# Patient Record
Sex: Male | Born: 1975 | Race: Black or African American | Hispanic: No | Marital: Single | Smoking: Former smoker
Health system: Southern US, Community
[De-identification: ages and names within clinical notes are randomized; demographics above are authoritative.]

## PROBLEM LIST (undated history)

## (undated) DIAGNOSIS — K219 Gastro-esophageal reflux disease without esophagitis: Secondary | ICD-10-CM

## (undated) DIAGNOSIS — E669 Obesity, unspecified: Secondary | ICD-10-CM

## (undated) DIAGNOSIS — E559 Vitamin D deficiency, unspecified: Secondary | ICD-10-CM

## (undated) DIAGNOSIS — S0502XA Injury of conjunctiva and corneal abrasion without foreign body, left eye, initial encounter: Secondary | ICD-10-CM

## (undated) DIAGNOSIS — R51 Headache: Secondary | ICD-10-CM

## (undated) DIAGNOSIS — I10 Essential (primary) hypertension: Secondary | ICD-10-CM

## (undated) DIAGNOSIS — J45909 Unspecified asthma, uncomplicated: Secondary | ICD-10-CM

## (undated) DIAGNOSIS — J309 Allergic rhinitis, unspecified: Secondary | ICD-10-CM

## (undated) DIAGNOSIS — F32A Depression, unspecified: Secondary | ICD-10-CM

## (undated) DIAGNOSIS — F329 Major depressive disorder, single episode, unspecified: Secondary | ICD-10-CM

## (undated) HISTORY — PX: WISDOM TOOTH EXTRACTION: SHX21

## (undated) HISTORY — DX: Major depressive disorder, single episode, unspecified: F32.9

## (undated) HISTORY — DX: Obesity, unspecified: E66.9

## (undated) HISTORY — DX: Allergic rhinitis, unspecified: J30.9

## (undated) HISTORY — DX: Essential (primary) hypertension: I10

## (undated) HISTORY — DX: Vitamin D deficiency, unspecified: E55.9

## (undated) HISTORY — DX: Gastro-esophageal reflux disease without esophagitis: K21.9

## (undated) HISTORY — DX: Depression, unspecified: F32.A

## (undated) HISTORY — PX: OTHER SURGICAL HISTORY: SHX169

## (undated) HISTORY — DX: Injury of conjunctiva and corneal abrasion without foreign body, left eye, initial encounter: S05.02XA

---

## 1997-09-05 ENCOUNTER — Emergency Department (HOSPITAL_COMMUNITY): Admission: EM | Admit: 1997-09-05 | Discharge: 1997-09-05 | Payer: Self-pay | Admitting: Emergency Medicine

## 1997-09-18 ENCOUNTER — Emergency Department (HOSPITAL_COMMUNITY): Admission: EM | Admit: 1997-09-18 | Discharge: 1997-09-18 | Payer: Self-pay | Admitting: Emergency Medicine

## 2008-07-12 ENCOUNTER — Emergency Department (HOSPITAL_COMMUNITY): Admission: EM | Admit: 2008-07-12 | Discharge: 2008-07-12 | Payer: Self-pay | Admitting: Emergency Medicine

## 2009-01-24 ENCOUNTER — Ambulatory Visit: Payer: Self-pay | Admitting: Family Medicine

## 2009-01-24 DIAGNOSIS — K219 Gastro-esophageal reflux disease without esophagitis: Secondary | ICD-10-CM | POA: Insufficient documentation

## 2009-01-24 DIAGNOSIS — E669 Obesity, unspecified: Secondary | ICD-10-CM | POA: Insufficient documentation

## 2009-03-26 ENCOUNTER — Ambulatory Visit: Payer: Self-pay | Admitting: Physician Assistant

## 2009-03-31 LAB — CONVERTED CEMR LAB
BUN: 11 mg/dL (ref 6–23)
Basophils Relative: 0 % (ref 0–1)
CO2: 25 meq/L (ref 19–32)
Cholesterol: 139 mg/dL (ref 0–200)
Creatinine, Ser: 0.8 mg/dL (ref 0.40–1.50)
Eosinophils Absolute: 0.1 10*3/uL (ref 0.0–0.7)
Eosinophils Relative: 2 % (ref 0–5)
Glucose, Bld: 76 mg/dL (ref 70–99)
HCT: 46.7 % (ref 39.0–52.0)
Helicobacter pylori, IgM: <0.89 (ref <0.89)
Hemoglobin: 16.1 g/dL (ref 13.0–17.0)
MCHC: 34.5 g/dL (ref 30.0–36.0)
MCV: 86.3 fL (ref 78.0–100.0)
Monocytes Absolute: 0.4 10*3/uL (ref 0.1–1.0)
Monocytes Relative: 9 % (ref 3–12)
RBC: 5.41 M/uL (ref 4.22–5.81)
Sodium: 140 meq/L (ref 135–145)
Total Bilirubin: 0.5 mg/dL (ref 0.3–1.2)
Total Protein: 7.2 g/dL (ref 6.0–8.3)
Triglycerides: 70 mg/dL (ref <150)
VLDL: 14 mg/dL (ref 0–40)

## 2009-05-02 ENCOUNTER — Emergency Department (HOSPITAL_COMMUNITY): Admission: EM | Admit: 2009-05-02 | Discharge: 2009-05-02 | Payer: Self-pay | Admitting: Emergency Medicine

## 2009-06-17 ENCOUNTER — Emergency Department (HOSPITAL_COMMUNITY): Admission: EM | Admit: 2009-06-17 | Discharge: 2009-06-17 | Payer: Self-pay | Admitting: Emergency Medicine

## 2009-06-23 ENCOUNTER — Ambulatory Visit: Payer: Self-pay | Admitting: Physician Assistant

## 2009-06-23 DIAGNOSIS — E559 Vitamin D deficiency, unspecified: Secondary | ICD-10-CM | POA: Insufficient documentation

## 2009-06-23 DIAGNOSIS — J45909 Unspecified asthma, uncomplicated: Secondary | ICD-10-CM | POA: Insufficient documentation

## 2009-06-23 DIAGNOSIS — F329 Major depressive disorder, single episode, unspecified: Secondary | ICD-10-CM | POA: Insufficient documentation

## 2009-06-23 DIAGNOSIS — J309 Allergic rhinitis, unspecified: Secondary | ICD-10-CM | POA: Insufficient documentation

## 2009-06-23 LAB — CONVERTED CEMR LAB
Bilirubin Urine: NEGATIVE
Ketones, urine, test strip: NEGATIVE
Specific Gravity, Urine: 1.02
Urobilinogen, UA: 0.2

## 2009-06-25 ENCOUNTER — Encounter: Payer: Self-pay | Admitting: Physician Assistant

## 2009-06-25 LAB — CONVERTED CEMR LAB: Vit D, 25-Hydroxy: 26 ng/mL — ABNORMAL LOW (ref 30–89)

## 2010-01-08 ENCOUNTER — Telehealth (INDEPENDENT_AMBULATORY_CARE_PROVIDER_SITE_OTHER): Payer: Self-pay | Admitting: Nurse Practitioner

## 2010-04-14 NOTE — Progress Notes (Signed)
Summary: Office Visit//DEPRESSION SCREENING  Office Visit//DEPRESSION SCREENING   Imported By: Arta Bruce 06/25/2009 10:30:24  _____________________________________________________________________  External Attachment:    Type:   Image     Comment:   External Document

## 2010-04-14 NOTE — Assessment & Plan Note (Signed)
Summary: 3 MONTH FU/ACID REFLUX/CPE///KT   Vital Signs:  Patient profile:   35 year old male Height:      72 inches Weight:      239 pounds BMI:     32.53 Temp:     98.0 degrees F oral Pulse rate:   74 / minute Pulse rhythm:   regular Resp:     18 per minute BP sitting:   132 / 87  (left arm) Cuff size:   large  Vitals Entered By: Armenia Shannon (June 23, 2009 9:04 AM) CC: cpe Is Patient Diabetic? No Pain Assessment Patient in pain? no       Does patient need assistance? Functional Status Self care Ambulation Normal   Primary Care Provider:  Tereso Newcomer, PA-C  CC:  cpe.  History of Present Illness: Here for CPE.  GERD:  Taking omeprazole 40 mg once daily.  Denies any stomach discomfort of dyspepsia.  Does have a lot of belching.  Has cut down on portion size.  No caffeine.  Stopped smoking THC in Jan. and no cigs.  No dysphagia.  No melena, hematochezia, hematemesis.  No unintended weight loss.  However, does have symptoms of increased belching and chest pressure with eating, nausea and questionable dysphagia.  Migraine HA's:  Went to ED last week.  I was not aware he had migraine HAs.  Reports h/o HAs on and off for years.  Usually could take extra strength tylenol or excedrin with relief.  Had an injection in the ED. Was told to ask about medicine for headaches.  Usually right sided.  No throbbing.  No nausea.  No scotoma.  He does note photophobia.  Reports headaches about once a week.  Seems to be related to allergies.  Reports a lot of sneezing and sinus congestion.    PHQ9= 6 today.  Has a child with terminal illness.  No SI.  Asthma History    Initial Asthma Severity Rating:    Age range: 12+ years    Symptoms: daily    Nighttime Awakenings: 0-2/month    Interferes w/ normal activity: some limitations    SABA use (not for EIB): daily    Asthma Severity Assessment: Moderate Persistent  Problems Prior to Update: 1)  Depression  (ICD-311) 2)  Asthma   (ICD-493.90) 3)  Allergic Rhinitis  (ICD-477.9) 4)  Vitamin D Deficiency  (ICD-268.9) 5)  Preventive Health Care  (ICD-V70.0) 6)  Obesity, Unspecified  (ICD-278.00) 7)  Gerd, Severe  (ICD-530.81) 8)  Family History Diabetes 1st Degree Relative  (ICD-V18.0)  Current Medications (verified): 1)  Omeprazole 40 Mg Cpdr (Omeprazole) .... Take 1 Tablet By Mouth Once A Day 2)  Ergocalciferol 50000 Unit Caps (Ergocalciferol) .Marland Kitchen.. 1 By Mouth Once A Week For 12 Weeks  Allergies (verified): No Known Drug Allergies  Past History:  Past Medical History: Current Problems:  VITAMIN D DEFICIENCY (ICD-268.9) OBESITY, UNSPECIFIED (ICD-278.00) GERD, SEVERE (ICD-530.81) Migraine Headaches  Family History: Reviewed history from 01/24/2009 and no changes required. Family History Diabetes 1st degree relative  Social History: Reviewed history from 01/24/2009 and no changes required. Occupation: Unemployed, day laborer Single Former Smoker. Quit 7 months. 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  The patient denies fever, chest pain, syncope, melena, hematochezia, and hematuria.    Physical Exam  General:  alert, well-developed,  and well-nourished.   Head:  normocephalic and atraumatic.   Eyes:  pupils equal, pupils round, pupils reactive to light, and no optic disk abnormalities.   Ears:  R ear normal and L ear normal.   Nose:  no external deformity.   Mouth:  pharynx pink and moist.   uvula enlarged Neck:  supple and no thyromegaly.   Lungs:  normal breath sounds.   Heart:  normal rate, regular rhythm, and no murmur.   Abdomen:  soft, non-tender, normal bowel sounds, and no hepatomegaly.   Rectal:  deferred Genitalia:  circumcised, no hydrocele, no varicocele, no scrotal masses, no cutaneous lesions, and no urethral  discharge.   Msk:  normal ROM.   Extremities:  no edema Neurologic:  alert & oriented X3 and cranial nerves II-XII intact.   Skin:  turgor normal.   Psych:  normally interactive.     Impression & Recommendations:  Problem # 1:  VITAMIN D DEFICIENCY (ICD-268.9)  check labs today  Orders: T-Vitamin D (25-Hydroxy) (16109-60454)  Problem # 2:  ALLERGIC RHINITIS (ICD-477.9)  suspect his headaches are related to his allergies add zyrtec and flonase naproxen as needed for headaches.  His updated medication list for this problem includes:    Cetirizine Hcl 10 Mg Tabs (Cetirizine hcl) .Marland Kitchen... Take 1 tablet by mouth once a day for  allergies    Flonase 50 Mcg/act Susp (Fluticasone propionate) .Marland Kitchen... 1-2 sprays each nostril once daily for allergies (use for 3 weeks, then take one week off, etc)  Problem # 3:  ASTHMA (ICD-493.90)  first time he has told me about this now tells me he used to have an inhaler add advair and proventil f/u with me in 6 weeks and consider changing to flovent AAP done   His updated medication list for this problem includes:    Advair Diskus 250-50 Mcg/dose Aepb (Fluticasone-salmeterol) .Marland Kitchen... 1 puff two times a day    Proventil Hfa 108 (90 Base) Mcg/act Aers (Albuterol sulfate) .Marland Kitchen... 1-2 puffs every 4-6 hours as needed  Problem # 4:  PREVENTIVE HEALTH CARE (ICD-V70.0)  Orders: UA Dipstick w/o Micro (manual) (09811)  Problem # 5:  GERD, SEVERE (ICD-530.81) still having symptoms of breakthrough dyspepsia on PPI change to protonix re-eval at f/u . Marland Kitchen Marland Kitchen if no better, refer to GI  The following medications were removed from the medication list:    Omeprazole 40 Mg Cpdr (Omeprazole) .Marland Kitchen... Take 1 tablet by mouth once a day His updated medication list for this problem includes:    Nexium 40 Mg Cpdr (Esomeprazole magnesium) .Marland Kitchen... Take 1 cap two times a day for one week, then change to 1 cap once daily.  Problem # 6:  DEPRESSION (ICD-311)  refer to LCSW  first  Orders: Psychology Referral (Psychology)  Complete Medication List: 1)  Ergocalciferol 50000 Unit Caps (Ergocalciferol) .Marland Kitchen.. 1 by mouth once a week for 12 weeks 2)  Cetirizine Hcl 10 Mg Tabs (Cetirizine hcl) .... Take 1 tablet by mouth once a day for  allergies 3)  Flonase 50 Mcg/act Susp (Fluticasone propionate) .Marland Kitchen.. 1-2 sprays each nostril once daily for allergies (use for 3 weeks, then take one week off, etc) 4)  Nexium 40 Mg Cpdr (Esomeprazole magnesium) .... Take 1 cap two times a day for one week, then change to 1 cap once daily. 5)  Advair Diskus 250-50 Mcg/dose Aepb (Fluticasone-salmeterol) .Marland Kitchen.. 1 puff two times a day 6)  Proventil Hfa 108 (90 Base) Mcg/act Aers (Albuterol sulfate) .Marland Kitchen.. 1-2  puffs every 4-6 hours as needed 7)  Peak Flow Meter Devi (Peak flow meter) .... Use as directed 8)  Naprosyn 500 Mg Tabs (Naproxen) .... Take 1 tablet by mouth two times a day as needed for pain or headache.  take with food.  Asthma Management Plan    Asthma Severity: Moderate Persistent    Personal best PEF: 430 liters/minute    Predicted PEF: 649 liters/minute    Working PEF: 430 liters/minute    Plan based on PEF formula: [employer] Zone: (Range: 340 to 430) ADVAIR DISKUS 250-50 MCG/DOSE AEPB:  1 inhalation twice a day   Yellow Zone: ADVAIR DISKUS 250-50 MCG/DOSE AEPB:  1 inhalation twice a day PROVENTIL HFA 108 (90 BASE) MCG/ACT AERS:  2 puffs every 4 hours as needed  Red Zone: ADVAIR DISKUS 250-50 MCG/DOSE AEPB:  1 inhalation twice a day PROVENTIL HFA 108 (90 BASE) MCG/ACT AERS:  2 puffs every 4 hours as needed Call your physician for shortness of breath.      Patient Instructions: 1)  Repeat urinalysis in 2 weeks (have patient do first morning specimen). 2)  Td shot today. 3)  Use advair two times a day every day. 4)  Make sure you rinse your mouth out well after using Advair. 5)  Use proventil as needed. 6)  See Asthma Action Plan. 7)  I gave you a  prescription for a peak flow meter that you can use if you want. 8)  Stop omeprazole and start taking Nexium.  Use two times a day for a week, then once daily. 9)  Use cetirizine and flonase for your allergies. 10)  Do not take excedrin or ibuprofen with Naproxen.  They are basically the same thing. 11)  Please schedule a follow-up appointment in 6 weeks with Makaila Windle for Asthma.  Prescriptions: NAPROSYN 500 MG TABS (NAPROXEN) Take 1 tablet by mouth two times a day as needed for pain or headache.  Take with food.  #30 x 1   Entered and Authorized by:   Tereso Newcomer PA-C   Signed by:   Tereso Newcomer PA-C on 06/23/2009   Method used:   Print then Give to Patient   RxID:   [id number] PEAK FLOW METER  DEVI (PEAK FLOW METER) use as directed  #1 x 0   Entered and Authorized by:   Tereso Newcomer PA-C   Signed by:   Tereso Newcomer PA-C on 06/23/2009   Method used:   Print then Give to Patient   RxID:   [id number] PROVENTIL HFA 108 (90 BASE) MCG/ACT AERS (ALBUTEROL SULFATE) 1-2 puffs every 4-6 hours as needed  #1 x 5   Entered and Authorized by:   Tereso Newcomer PA-C   Signed by:   Tereso Newcomer PA-C on 06/23/2009   Method used:   Print then Give to Patient   RxID:   [id number] ADVAIR DISKUS 250-50 MCG/DOSE AEPB (FLUTICASONE-SALMETEROL) 1 puff two times a day  #1 x 5   Entered and Authorized by:   Tereso Newcomer PA-C   Signed by:   Tereso Newcomer PA-C on 06/23/2009   Method used:   Print then Give to Patient   RxID:   [id number] NEXIUM 40 MG CPDR (ESOMEPRAZOLE MAGNESIUM) Take 1 cap two times a day for one week, then change to 1 cap once daily.  #30 x 5   Entered and Authorized by:   Tereso Newcomer PA-C   Signed by:   Tereso Newcomer PA-C on 06/23/2009  Method used:   Print then Give to Patient   RxID:   [id number] FLONASE 50 MCG/ACT SUSP (FLUTICASONE PROPIONATE) 1-2 sprays each nostril once daily for allergies (use for 3 weeks, then take one week off, etc)  #1 x 5   Entered and  Authorized by:   Tereso Newcomer PA-C   Signed by:   Tereso Newcomer PA-C on 06/23/2009   Method used:   Print then Give to Patient   RxID:   [id number] CETIRIZINE HCL 10 MG TABS (CETIRIZINE HCL) Take 1 tablet by mouth once a day for  allergies  #30 x 5   Entered and Authorized by:   Tereso Newcomer PA-C   Signed by:   Tereso Newcomer PA-C on 06/23/2009   Method used:   Print then Give to Patient   RxID:   [id number]   Laboratory Results   Urine Tests  Date/Time Received: June 23, 2009 9:20 AM   Routine Urinalysis   Glucose: negative   (Normal Range: Negative) Bilirubin: negative   (Normal Range: Negative) Ketone: negative   (Normal Range: Negative) Spec. Gravity: 1.020   (Normal Range: 1.003-1.035) Blood: negative   (Normal Range: Negative) pH: 7.5   (Normal Range: 5.0-8.0) Protein: trace   (Normal Range: Negative) Urobilinogen: 0.2   (Normal Range: 0-1) Nitrite: negative   (Normal Range: Negative) Leukocyte Esterace: negative   (Normal Range: Negative)

## 2010-04-14 NOTE — Assessment & Plan Note (Signed)
Summary: 2 MO F/U///////RJP   Vital Signs:  Patient profile:   35 year old male Height:      72 inches Weight:      245 pounds BMI:     33.35 Temp:     98.2 degrees F oral Pulse rate:   70 / minute Pulse rhythm:   regular Resp:     20 per minute BP sitting:   141 / 91  (left arm) Cuff size:   large  Vitals Entered By: [place] Shannon (March 26, 2009 10:40 AM) CC: two month f/u.... pt has questions about the med he is using... pt would like something a little stronger... Is Patient Diabetic? No Pain Assessment Patient in pain? no      CBG Result 86  Does patient need assistance? Functional Status Self care Ambulation Normal   Primary Care Provider:  Tereso Newcomer, PA-C  CC:  two month f/u.... pt has questions about the med he is using... pt would like something a little stronger....  History of Present Illness: 35 year old male presents for followup on acid reflux.  He was previously seen by Dr. Carolyne Fiscal.  He was prescribed omeprazole 20 mg a day.  Labs were ordered but never performed.  The patient notes that his symptoms, overall, are improved.  He mainly had retrosternal burning, water brash symptoms and belching.  He has also had some nausea.  He denies dysphagia.  He denies hematemesis.  He denies melena or hematochezia.  He denies odynophagia.  He denies any weight loss.  He does admit to drinking caffeine several times a week.  He also admits to eating spicy foods. a  Habits & Providers  Alcohol-Tobacco-Diet     Alcohol drinks/day: <1     Tobacco Status: quit  Problems Prior to Update: 1)  Preventive Health Care  (ICD-V70.0) 2)  Obesity, Unspecified  (ICD-278.00) 3)  Gerd, Severe  (ICD-530.81) 4)  Family History Diabetes 1st Degree Relative  (ICD-V18.0)  Current Medications (verified): 1)  Omeprazole 20 Mg Cpdr (Omeprazole) .Marland Kitchen.. 1 By Mouth Daily  Allergies (verified): No Known Drug Allergies  Past History:  Social History: Last updated:  01/24/2009 Occupation: Unemployed, day laborer Single Former Smoker. Quit 7 months. Alcohol use-no Drug use-no, h/o THC last 7 months ago Regular exercise-no  Physical Exam  General:  alert, well-developed, and well-nourished.   Head:  normocephalic and atraumatic.   Neck:  supple and no carotid bruits.   Lungs:  normal breath sounds, no crackles, and no wheezes.   Heart:  normal rate and regular rhythm.   Abdomen:  soft, non-tender, normal bowel sounds, and no hepatomegaly.   Neurologic:  alert & oriented X3 and cranial nerves II-XII intact.   Psych:  normally interactive.     Impression & Recommendations:  Problem # 1:  GERD, SEVERE (ICD-530.81) symptoms overall improved he has not been following proper diet like he should increase omeprazole to 40 mg daily labs not in system from last visit will check on them or redraw if no improvement at f/u, consider referral to GI  His updated medication list for this problem includes:    Omeprazole 40 Mg Cpdr (Omeprazole) .Marland Kitchen... Take 1 tablet by mouth once a day  Problem # 2:  PREVENTIVE HEALTH CARE (ICD-V70.0)  as above, check on labs from last visit will also get CBC, TSH and RPR schedule CPE  Orders: T-CBC w/Diff ([address]109-60454) T-TSH (09811-91478) T-Syphilis Test (RPR) (29562-13086)  Complete Medication List: 1)  Omeprazole 40 Mg Cpdr (  Omeprazole) .... Take 1 tablet by mouth once a day  Patient Instructions: 1)  Please schedule a follow-up appointment in 3 months with Lotta Frankenfield for acid reflux and CPE. 2)  Avoid foods high in acid(tomatoes, citrus juices,spicy foods).Avoid eating within two hours of lying down or before exercising. Do not over eat: try smaller more frequent meals. Elevate head of bed twelve inches when sleeping.  Prescriptions: OMEPRAZOLE 40 MG CPDR (OMEPRAZOLE) Take 1 tablet by mouth once a day  #30 x 5   Entered and Authorized by:   Tereso Newcomer PA-C   Signed by:   Tereso Newcomer PA-C on 03/26/2009   Method  used:   Print then Give to Patient   RxID:   [id number]     Laboratory Results   Blood Tests     CBG Random:: 86mg /dL  Date/Time Received: 14-78-29  Other Tests  Rapid HIV: negative

## 2010-04-14 NOTE — Progress Notes (Signed)
Summary: Query:  Refill Nexium?  Phone Note Outgoing Call   Summary of Call: Do you want to refill his nexium?  Last seen 06/2009. Initial call taken by: Dutch Quint RN,  January 08, 2010 5:06 PM  Follow-up for Phone Call        ok to refill Follow-up by: Lehman Prom FNP,  January 08, 2010 5:57 PM  Additional Follow-up for Phone Call Additional follow up Details #1::        Noted. Dutch Quint RN  January 09, 2010 10:58 AM

## 2010-06-03 LAB — POCT I-STAT, CHEM 8
Chloride: 110 mEq/L (ref 96–112)
HCT: 47 % (ref 39.0–52.0)
Potassium: 3.6 mEq/L (ref 3.5–5.1)

## 2010-09-11 ENCOUNTER — Other Ambulatory Visit: Payer: Self-pay | Admitting: Physician Assistant

## 2011-09-28 ENCOUNTER — Emergency Department (HOSPITAL_COMMUNITY)
Admission: EM | Admit: 2011-09-28 | Discharge: 2011-09-28 | Disposition: A | Discharge status: Home / Self Care | Payer: Medicaid Other | Attending: Emergency Medicine | Admitting: Emergency Medicine

## 2011-09-28 ENCOUNTER — Encounter (HOSPITAL_COMMUNITY): Payer: Self-pay | Admitting: Emergency Medicine

## 2011-09-28 DIAGNOSIS — Y9383 Activity, rough housing and horseplay: Secondary | ICD-10-CM | POA: Insufficient documentation

## 2011-09-28 DIAGNOSIS — S0502XA Injury of conjunctiva and corneal abrasion without foreign body, left eye, initial encounter: Secondary | ICD-10-CM

## 2011-09-28 DIAGNOSIS — S058X9A Other injuries of unspecified eye and orbit, initial encounter: Secondary | ICD-10-CM | POA: Insufficient documentation

## 2011-09-28 DIAGNOSIS — IMO0002 Reserved for concepts with insufficient information to code with codable children: Secondary | ICD-10-CM | POA: Insufficient documentation

## 2011-09-28 DIAGNOSIS — K219 Gastro-esophageal reflux disease without esophagitis: Secondary | ICD-10-CM | POA: Insufficient documentation

## 2011-09-28 HISTORY — DX: Gastro-esophageal reflux disease without esophagitis: K21.9

## 2011-09-28 MED ORDER — PROPARACAINE HCL 0.5 % OP SOLN
1.0000 [drp] | Freq: Once | OPHTHALMIC | Status: AC
  Administered 2011-09-28: 1 [drp] via OPHTHALMIC
  Filled 2011-09-28: qty 15

## 2011-09-28 MED ORDER — OXYCODONE-ACETAMINOPHEN 5-325 MG PO TABS: 1.0000 | ORAL_TABLET | ORAL | Status: AC | PRN

## 2011-09-28 MED ORDER — FLUORESCEIN SODIUM 1 MG OP STRP
1.0000 | ORAL_STRIP | Freq: Once | OPHTHALMIC | Status: AC
  Administered 2011-09-28: 1 via OPHTHALMIC
  Filled 2011-09-28: qty 1

## 2011-09-28 MED ORDER — OXYCODONE-ACETAMINOPHEN 5-325 MG PO TABS
1.0000 | ORAL_TABLET | Freq: Once | ORAL | Status: AC
  Administered 2011-09-28: 1 via ORAL
  Filled 2011-09-28: qty 1

## 2011-09-28 MED ORDER — CIPROFLOXACIN HCL 0.3 % OP SOLN: 1.0000 [drp] | OPHTHALMIC | Status: AC

## 2011-09-28 NOTE — ED Notes (Signed)
Patient discharge to home with written and verbal instructions with steady gait. Respirations equal and unlabored. Skin warm and dry. No acute distress noted. 

## 2011-09-28 NOTE — ED Notes (Signed)
Pt states he was poked in his left eye by someones finger about 2200 Monday night  Pt states his eye is painful and it is causing his nose to run   Eye is red and irritated and pt has swelling noted to his eye

## 2011-09-28 NOTE — ED Provider Notes (Signed)
History     CSN: [id number]  Arrival date & time 09/28/11  4098   First MD Initiated Contact with Patient 09/28/11 0407      Chief Complaint  Patient presents with  . Eye Injury    (Consider location/radiation/quality/duration/timing/severity/associated sxs/prior treatment) HPI Comments: Pt was rough-housing with a friend when he was accidentally poked in the L eye, at about 2200 this evening. Pt has had severe burning pain, FB sensation, watering, redness, and blurred vision to the eye since this occurred. States his nose has been running as well. He is not a contact lens wearer. Has not been seen by ophtho previously. No tx PTA.  Patient is a 36 y.o. male presenting with eye injury. The history is provided by the patient.  Eye Injury This is a new problem. The current episode started today. The problem occurs constantly. The problem has been gradually worsening. Associated symptoms include headaches and a visual change. Pertinent negatives include no vomiting. Nothing aggravates the symptoms. He has tried nothing for the symptoms.    Past Medical History  Diagnosis Date  . Acid reflux     History reviewed. No pertinent past surgical history.  Family History  Problem Relation Age of Onset  . Diabetes Other   . Hypertension Other     History  Substance Use Topics  . Smoking status: Never Smoker   . Smokeless tobacco: Not on file  . Alcohol Use: Yes     social      Review of Systems  Gastrointestinal: Negative for vomiting.  Neurological: Positive for headaches.  ROS as per HPI  Allergies  Review of patient's allergies indicates no known allergies.  Home Medications   Current Outpatient Rx  Name Route Sig Dispense Refill  . ALBUTEROL SULFATE HFA 108 (90 BASE) MCG/ACT IN AERS Inhalation Inhale 2 puffs into the lungs every 6 (six) hours as needed.    Marland Kitchen ESOMEPRAZOLE MAGNESIUM 40 MG PO CPDR Oral Take 40 mg by mouth daily before breakfast.    . NAPROXEN 500 MG PO  TABS Oral Take 500 mg by mouth 2 (two) times daily as needed.    Marland Kitchen CIPROFLOXACIN HCL 0.3 % OP SOLN Left Eye Place 1 drop into the left eye every 2 (two) hours. Administer 1 drop, every 2 hours, while awake, for 2 days. Then 1 drop, every 4 hours, while awake, for the next 5 days. 5 mL 0  . OXYCODONE-ACETAMINOPHEN 5-325 MG PO TABS Oral Take 1 tablet by mouth every 4 (four) hours as needed for pain. 10 tablet 0    BP 120/70  Pulse 82  Temp 98 F (36.7 C) (Oral)  Resp 20  SpO2 100%  Physical Exam  Nursing note and vitals reviewed. Constitutional: He appears well-developed and well-nourished. No distress.  HENT:  Head: Normocephalic and atraumatic.  Eyes: EOM are normal. Pupils are equal, round, and reactive to light. No foreign bodies found.  Slit lamp exam:      The left eye shows corneal abrasion and fluorescein uptake. The left eye shows no corneal ulcer and no foreign body.         L eye: erythema of sclera, most pronounced medially; three discrete areas of increased fluorescein uptake on Woods lamp exam, as diagrammed; pt with excess tearing of eye and light sensitivity; vision 20/40 uncorrected in affected eye, 20/20 in good uncorrected eye, 20/20 bilat  Neck: Normal range of motion.  Cardiovascular: Normal rate.   Pulmonary/Chest: Effort normal.  Musculoskeletal: Normal  range of motion.  Neurological: He is alert.  Skin: Skin is warm and dry. He is not diaphoretic.  Psychiatric: He has a normal mood and affect.    ED Course  Procedures (including critical care time)  Labs Reviewed - No data to display No results found.   1. Corneal abrasion, left       MDM  Pt presents with FB sensation, watering, pain, and blurred vision to eye since getting poked in the eye earlier this evening with a finger. 3 discrete areas of increased uptake on fluorescein stain indicating corneal abrasion. Pt had almost immediate relief of sx with administration of proparacaine drops. Rxes for  Ciloxan and Percocet. Findings shown to and d/w pt and mom at bedside. Emphasized importance of prompt f/u with ophtho. They verbalized understanding of this. Reasons to return discussed.         Grant Fontana, PA-C 09/28/11 [phone]

## 2011-10-08 NOTE — ED Provider Notes (Signed)
Medical screening examination/treatment/procedure(s) were performed by non-physician practitioner and as supervising physician I was immediately available for consultation/collaboration.  Rotha Cassels K Areej Tayler-Rasch, MD 10/08/11 2312 

## 2011-12-23 ENCOUNTER — Emergency Department (HOSPITAL_COMMUNITY)
Admission: EM | Admit: 2011-12-23 | Discharge: 2011-12-23 | Disposition: A | Discharge status: Home / Self Care | Payer: Medicaid Other | Attending: Emergency Medicine | Admitting: Emergency Medicine

## 2011-12-23 ENCOUNTER — Encounter (HOSPITAL_COMMUNITY): Payer: Self-pay | Admitting: Emergency Medicine

## 2011-12-23 ENCOUNTER — Emergency Department (HOSPITAL_COMMUNITY): Payer: Medicaid Other

## 2011-12-23 DIAGNOSIS — J45909 Unspecified asthma, uncomplicated: Secondary | ICD-10-CM | POA: Insufficient documentation

## 2011-12-23 DIAGNOSIS — R079 Chest pain, unspecified: Secondary | ICD-10-CM

## 2011-12-23 DIAGNOSIS — K219 Gastro-esophageal reflux disease without esophagitis: Secondary | ICD-10-CM | POA: Insufficient documentation

## 2011-12-23 HISTORY — DX: Unspecified asthma, uncomplicated: J45.909

## 2011-12-23 HISTORY — DX: Headache: R51

## 2011-12-23 LAB — COMPREHENSIVE METABOLIC PANEL
Alkaline Phosphatase: 68 U/L (ref 39–117)
BUN: 9 mg/dL (ref 6–23)
Chloride: 102 mEq/L (ref 96–112)
GFR calc Af Amer: >90 mL/min (ref >90)
Glucose, Bld: 98 mg/dL (ref 70–99)
Potassium: 3.6 mEq/L (ref 3.5–5.1)
Total Bilirubin: 0.4 mg/dL (ref 0.3–1.2)

## 2011-12-23 LAB — POCT I-STAT TROPONIN I: Troponin i, poc: 0 ng/mL (ref 0.00–0.08)

## 2011-12-23 LAB — CBC
HCT: 44.1 % (ref 39.0–52.0)
Hemoglobin: 15.7 g/dL (ref 13.0–17.0)
WBC: 6 10*3/uL (ref 4.0–10.5)

## 2011-12-23 MED ORDER — SUCRALFATE 1 G PO TABS: 1.0000 g | ORAL_TABLET | Freq: Four times a day (QID) | ORAL | Status: DC

## 2011-12-23 MED ORDER — SODIUM CHLORIDE 0.9 % IV SOLN
1000.0000 mL | INTRAVENOUS | Status: DC
  Administered 2011-12-23: 1000 mL via INTRAVENOUS

## 2011-12-23 MED ORDER — GI COCKTAIL ~~LOC~~
30.0000 mL | Freq: Once | ORAL | Status: AC
  Administered 2011-12-23: 30 mL via ORAL
  Filled 2011-12-23: qty 30

## 2011-12-23 NOTE — ED Notes (Signed)
Discharge instructions reviewed. Rx given. All questions answered.  

## 2011-12-23 NOTE — ED Notes (Signed)
Pt presenting to ed with c/o chest discomfort x 1 week. Pt states positive shortness of breath. Pt states "it feels like air is pressing on my chest". Pt states positive nausea no vomiting pt states he makes his self throw up to get relief. Pt states chest tightness. Pt states he isn't sure if it's his reflux but it's worse than usual

## 2011-12-23 NOTE — ED Provider Notes (Signed)
History     CSN: [id number]  Arrival date & time 12/23/11  1647   First MD Initiated Contact with Patient 12/23/11 1923      Chief Complaint  Patient presents with  . chest discomfort    HPI Comments: He has been having several episode over the last few weeks.  It feels like gas in his chest but he also feels like he cant breathe and something is pressing on his chest.  Today it was more severe so he became concerned. Pt had pizza and doritos, and ham sandwich today.  Patient is a 36 y.o. male presenting with chest pain. The history is provided by the patient.  Chest Pain The chest pain began 1 - 2 weeks ago (Today it has been constant since 1330). Chest pain occurs constantly. The chest pain is unchanged. The severity of the pain is moderate. The quality of the pain is described as pressure-like. The pain does not radiate. Exacerbated by: not worse with exertion, increases with eating. Pertinent negatives for primary symptoms include no fever.  Pertinent negatives for past medical history include no CAD, no DVT, no MI and no PE.  Pertinent negatives for family medical history include: no CAD in family.   No recent trips or travel.  Past Medical History  Diagnosis Date  . Acid reflux   . Headache   . Asthma     History reviewed. No pertinent past surgical history.  Family History  Problem Relation Age of Onset  . Diabetes Other   . Hypertension Other     History  Substance Use Topics  . Smoking status: Never Smoker   . Smokeless tobacco: Not on file  . Alcohol Use: Yes     social      Review of Systems  Constitutional: Negative for fever.  Cardiovascular: Positive for chest pain.  All other systems reviewed and are negative.    Allergies  Review of patient's allergies indicates no known allergies.  Home Medications   Current Outpatient Rx  Name Route Sig Dispense Refill  . ALBUTEROL SULFATE HFA 108 (90 BASE) MCG/ACT IN AERS Inhalation Inhale 2 puffs into  the lungs every 6 (six) hours as needed. For shortness of breath.    Marland Kitchen NAPROXEN 500 MG PO TABS Oral Take 500 mg by mouth 2 (two) times daily as needed. For pain.    Marland Kitchen PANTOPRAZOLE SODIUM 40 MG PO TBEC Oral Take 40 mg by mouth daily.      BP 133/79  Pulse 72  Temp 98.2 F (36.8 C) (Oral)  Resp 20  SpO2 98%  Physical Exam  Nursing note and vitals reviewed. Constitutional: He appears well-developed and well-nourished. No distress.  HENT:  Head: Normocephalic and atraumatic.  Right Ear: External ear normal.  Left Ear: External ear normal.  Eyes: Conjunctivae normal are normal. Right eye exhibits no discharge. Left eye exhibits no discharge. No scleral icterus.  Neck: Neck supple. No tracheal deviation present.  Cardiovascular: Normal rate, regular rhythm and intact distal pulses.   Pulmonary/Chest: Effort normal and breath sounds normal. No stridor. No respiratory distress. He has no wheezes. He has no rales.  Abdominal: Soft. Bowel sounds are normal. He exhibits no distension. There is no tenderness. There is no rebound and no guarding.  Musculoskeletal: He exhibits no edema and no tenderness.  Neurological: He is alert. He has normal strength. No sensory deficit. Cranial nerve deficit:  no gross defecits noted. He exhibits normal muscle tone. He displays no seizure  activity. Coordination normal.  Skin: Skin is warm and dry. No rash noted.  Psychiatric: He has a normal mood and affect.    ED Course  Procedures (including critical care time) Rate 72 SINUS RHYTHM ~ normal P axis, V-rate 50- 99 BORDERLINE RIGHT AXIS DEVIATION ~ QRS axis ( 90, 99) ST ELEV, PROBABLE NORMAL EARLY REPOL PATTERN ~ ST elevation, age<55 BASELINE WANDER IN LEAD(S) V6  Labs Reviewed  CBC  COMPREHENSIVE METABOLIC PANEL  POCT I-STAT TROPONIN I   Dg Chest 2 View  12/23/2011  *RADIOLOGY REPORT*  Clinical Data: Chest pain, shortness of breath, cough  CHEST - 2 VIEW  Comparison: 07/12/2008  Findings:  Unchanged cardiac silhouette and mediastinal contours. No focal parenchymal opacities.  No pleural effusion or pneumothorax.  Unchanged bones.  IMPRESSION: No acute cardiopulmonary disease.  Specifically, no evidence of pneumonia.   Original Report Authenticated By: Waynard Reeds, M.D.      1. Chest pain   2. GERD (gastroesophageal reflux disease)       MDM  The patient's symptoms are suggestive of esophageal reflux. He has been having symptoms for one week and has a reassuring EKG as well as cardiac enzymes.  There is no evidence of pneumonia or other acute cardiopulmonary abnormality noted on chest x-ray the patient is low risk for pulmonary embolism and perc negative.  He noted some improvement with the GI cocktail. I will try adding Carafate to his regimen and have him follow up with her primary care Dr. consider seeing a gastroenterologist        Celene Kras, MD 12/23/11 2222

## 2011-12-27 ENCOUNTER — Encounter (HOSPITAL_COMMUNITY): Payer: Self-pay | Admitting: Emergency Medicine

## 2011-12-27 ENCOUNTER — Emergency Department (HOSPITAL_COMMUNITY)
Admission: EM | Admit: 2011-12-27 | Discharge: 2011-12-28 | Disposition: A | Discharge status: Home / Self Care | Payer: Medicaid Other | Attending: Emergency Medicine | Admitting: Emergency Medicine

## 2011-12-27 DIAGNOSIS — K30 Functional dyspepsia: Secondary | ICD-10-CM

## 2011-12-27 DIAGNOSIS — R079 Chest pain, unspecified: Secondary | ICD-10-CM | POA: Insufficient documentation

## 2011-12-27 DIAGNOSIS — J45909 Unspecified asthma, uncomplicated: Secondary | ICD-10-CM | POA: Insufficient documentation

## 2011-12-27 DIAGNOSIS — K3189 Other diseases of stomach and duodenum: Secondary | ICD-10-CM | POA: Insufficient documentation

## 2011-12-27 DIAGNOSIS — R1013 Epigastric pain: Secondary | ICD-10-CM | POA: Insufficient documentation

## 2011-12-27 DIAGNOSIS — R0602 Shortness of breath: Secondary | ICD-10-CM | POA: Insufficient documentation

## 2011-12-27 MED ORDER — SIMETHICONE 80 MG PO CHEW
80.0000 mg | CHEWABLE_TABLET | Freq: Once | ORAL | Status: AC
  Administered 2011-12-27: 80 mg via ORAL
  Filled 2011-12-27: qty 1

## 2011-12-27 MED ORDER — GI COCKTAIL ~~LOC~~
30.0000 mL | Freq: Once | ORAL | Status: AC
  Administered 2011-12-27: 30 mL via ORAL
  Filled 2011-12-27: qty 30

## 2011-12-27 MED ORDER — PANTOPRAZOLE SODIUM 40 MG IV SOLR
40.0000 mg | Freq: Once | INTRAVENOUS | Status: AC
  Administered 2011-12-27: 40 mg via INTRAVENOUS
  Filled 2011-12-27: qty 40

## 2011-12-27 MED ORDER — FAMOTIDINE IN NACL 20-0.9 MG/50ML-% IV SOLN
20.0000 mg | Freq: Once | INTRAVENOUS | Status: AC
  Administered 2011-12-27: 20 mg via INTRAVENOUS
  Filled 2011-12-27: qty 50

## 2011-12-27 NOTE — ED Notes (Signed)
Pt c/o mid sternal chest pain x's 1 week. Pt hx gastric reflux. Was seen and evalated one week ago for similar s/s. No relief w/ prescriptions.

## 2011-12-27 NOTE — ED Notes (Addendum)
Patient is alert and oriented x3.  He is complaining of the same discomfort that he was Experiencing from his last visit 4 days ago.  He states that it is made worse when he eats And he feels like he has a gas bubble in the substernal area

## 2011-12-27 NOTE — ED Provider Notes (Signed)
History     CSN: [id number]  Arrival date & time 12/27/11  4098   First MD Initiated Contact with Patient 12/27/11 2052      Chief Complaint  Patient presents with  . Chest Pain    (Consider location/radiation/quality/duration/timing/severity/associated sxs/prior treatment) HPI History provided by pt.   Pt has had 1.5wks of constant chest pressure.  It feels like his chest is full of gas and he burps frequently.  Burping alleviates discomfort for a brief time but then it returns.  Aggravated by laying flat and eating.  No associated fever, cough, SOB, abdominal pain or LE pain/edema.  No RF for PE.  Per prior chart, pt seen for same sx 5 days ago, had a negative cardiac work up and sx improved w/ GI cocktail.  Diagnosed w/ esophageal reflux and d/c'd home w/ carafate to supplement his daily pantoprazole.  Pt reports no improvement w/ this medication and sx are now intolerable.   Past Medical History  Diagnosis Date  . Acid reflux   . Headache   . Asthma     History reviewed. No pertinent past surgical history.  Family History  Problem Relation Age of Onset  . Diabetes Other   . Hypertension Other     History  Substance Use Topics  . Smoking status: Former Games developer  . Smokeless tobacco: Not on file  . Alcohol Use: No     social      Review of Systems  All other systems reviewed and are negative.    Allergies  Review of patient's allergies indicates no known allergies.  Home Medications   Current Outpatient Rx  Name Route Sig Dispense Refill  . ALBUTEROL SULFATE HFA 108 (90 BASE) MCG/ACT IN AERS Inhalation Inhale 2 puffs into the lungs every 6 (six) hours as needed. For shortness of breath.    Marland Kitchen NAPROXEN 500 MG PO TABS Oral Take 500 mg by mouth 2 (two) times daily as needed. For pain.    Marland Kitchen PANTOPRAZOLE SODIUM 40 MG PO TBEC Oral Take 40 mg by mouth daily.    . SUCRALFATE 1 G PO TABS Oral Take 1 g by mouth 4 (four) times daily.      BP 145/77  Pulse 90  Temp  98.4 F (36.9 C) (Oral)  Resp 16  Ht 6' (1.829 m)  Wt 230 lb (104.327 kg)  BMI 31.19 kg/m2  SpO2 100%  Physical Exam  Nursing note and vitals reviewed. Constitutional: He is oriented to person, place, and time. He appears well-developed and well-nourished. No distress.  HENT:  Head: Normocephalic and atraumatic.  Eyes:       Normal appearance  Neck: Normal range of motion.  Cardiovascular: Normal rate, regular rhythm and intact distal pulses.   Pulmonary/Chest: Effort normal and breath sounds normal. No respiratory distress. He exhibits no tenderness.       No pleuritic pain reported  Abdominal: Soft. Bowel sounds are normal. He exhibits no distension. There is no tenderness. There is no guarding.  Musculoskeletal: Normal range of motion.       No peripheral edema or calf tenderness  Neurological: He is alert and oriented to person, place, and time.  Skin: Skin is warm and dry. No rash noted.  Psychiatric: He has a normal mood and affect. His behavior is normal.    ED Course  Procedures (including critical care time)   Date: 12/27/2011  Rate: 92  Rhythm: normal sinus rhythm  QRS Axis: normal  Intervals: normal  ST/T Wave abnormalities: normal  Conduction Disutrbances:left anterior fascicular block  Narrative Interpretation:   Old EKG Reviewed: none available   Labs Reviewed - No data to display Dg Chest 2 View  12/28/2011  *RADIOLOGY REPORT*  Clinical Data: Shortness of breath and chest pain for 24 hours.  CHEST - 2 VIEW  Comparison: 12/23/2011  Findings: The heart size and pulmonary vascularity are normal. The lungs appear clear and expanded without focal air space disease or consolidation. No blunting of the costophrenic angles.  No pneumothorax.  Mediastinal contours appear intact. Mild degenerative changes in the spine.  No significant change since previous study.  IMPRESSION: No evidence of active pulmonary disease.   Original Report Authenticated By: Marlon Pel, M.D.      1. Indigestion   2. Asthma       MDM  36yo M w/ h/o acid reflux presents for the second time in one week w/ chest pressure and burping.  Pain atypical, low risk ACS and EKG non-ischemic.  No RF for or exam findings consistent w/ PE.  High suspicion for indigestion/esophagitis.  IV protonix, pepcid + GI cocktail and simethicone ordered for sx.  Will reassess shortly.  10:19 PM   Pt reported that the chest discomfort improved but he felt like he was not able to get a good deep breath.  The sensation feels similar to when he has had asthma attacks in the past.  No respiratory distress and nml breath sounds on re-examination.  Nursing staff ambulated and pt maintained his sats and neither chest discomfort or dyspnea increased.   CXR repeated from last Thursday and is negative.  Pt received a breathing treatment w/ sx improvement.  D/c'd home w/ an albuterol inhaler as well as pepcid.  Discussed foods to avoid, Recommended that he try otc simethicone and f/u with GI.Marland Kitchen  Referred to [facility] as well.  Return precautions discussed.         Otilio Miu, [place] 12/28/11 [phone]

## 2011-12-28 ENCOUNTER — Emergency Department (HOSPITAL_COMMUNITY): Payer: Medicaid Other

## 2011-12-28 ENCOUNTER — Encounter: Payer: Self-pay | Admitting: Internal Medicine

## 2011-12-28 MED ORDER — ALBUTEROL SULFATE HFA 108 (90 BASE) MCG/ACT IN AERS
2.0000 | INHALATION_SPRAY | Freq: Four times a day (QID) | RESPIRATORY_TRACT | Status: DC
  Administered 2011-12-28: 2 via RESPIRATORY_TRACT
  Filled 2011-12-28: qty 6.7

## 2011-12-28 MED ORDER — IPRATROPIUM BROMIDE 0.02 % IN SOLN
0.5000 mg | Freq: Once | RESPIRATORY_TRACT | Status: AC
  Administered 2011-12-28: 0.5 mg via RESPIRATORY_TRACT
  Filled 2011-12-28: qty 2.5

## 2011-12-28 MED ORDER — ALBUTEROL SULFATE (5 MG/ML) 0.5% IN NEBU
5.0000 mg | INHALATION_SOLUTION | Freq: Once | RESPIRATORY_TRACT | Status: AC
  Administered 2011-12-28: 2.5 mg via RESPIRATORY_TRACT
  Filled 2011-12-28: qty 0.5

## 2011-12-28 MED ORDER — FAMOTIDINE 10 MG PO TABS: ORAL_TABLET | ORAL | Status: DC

## 2011-12-28 NOTE — ED Notes (Signed)
Patient is alert and oriented x3.  He was given DC instructions and follow up visit instructions.  Patient gave verbal understanding.  He was DC ambulatory under his own power to home.  V/S stable.  He was not showing any signs of distress on DC 

## 2011-12-28 NOTE — ED Notes (Signed)
  Patient back from XRAY Respiratory called for breathing treatment

## 2011-12-28 NOTE — ED Provider Notes (Signed)
Medical screening examination/treatment/procedure(s) were performed by non-physician practitioner and as supervising physician I was immediately available for consultation/collaboration.   Loren Racer, MD 12/28/11 [phone]

## 2011-12-28 NOTE — ED Notes (Signed)
Pt. Ambulated in hall, ate 10% of snack.  Tolerated well, but pt states he feels sob while standing/walking.

## 2011-12-31 ENCOUNTER — Ambulatory Visit (INDEPENDENT_AMBULATORY_CARE_PROVIDER_SITE_OTHER): Payer: Medicaid Other | Admitting: Internal Medicine

## 2011-12-31 ENCOUNTER — Encounter: Payer: Self-pay | Admitting: Internal Medicine

## 2011-12-31 VITALS — BP 136/84 | HR 70 | Ht 72.0 in | Wt 231.0 lb

## 2011-12-31 DIAGNOSIS — R079 Chest pain, unspecified: Secondary | ICD-10-CM

## 2011-12-31 DIAGNOSIS — R1310 Dysphagia, unspecified: Secondary | ICD-10-CM

## 2011-12-31 DIAGNOSIS — K219 Gastro-esophageal reflux disease without esophagitis: Secondary | ICD-10-CM

## 2011-12-31 NOTE — Progress Notes (Signed)
  Subjective:    Patient ID: Antonio Sanchez, male    DOB: 01/03/1976, 36 y.o.   MRN: [id number]  HPI This is a middle-aged [place] American man recently seen in the emergency department for chest pressure. He carries a diagnosis of GERD and has been on pantoprazole 40 mg daily for some time. He has been having a several week history of intermittent chest pressure, usually after he eats his evening meal, it feels like in sitting in his chest and will not pass. Over time it does get better but he has difficulty belching and even passing flatus during these episodes. He has not vomited or regurgitated. He was in the emergency department where a chest x-ray was negative as were labs, and a GI cocktail provided symptomatic relief. He is not having classic heartburn or indigestion. He has some respiratory difficulty and inability gather in his breath when he has these problems. He suffers from asthma, he uses inhaler about once a week, but in general does not have daily troubles with breathing. The chest symptoms do occur most days at this point. He is not describing unintentional weight loss or other GI problems at this time.  He does not currently have a PCP, it was [facility].  Medications, allergies, past medical history, past surgical history, family history and social history are reviewed and updated in the EMR.  Review of Systems All other review of systems are taken and are negative except for those things mentioned in the history of present illness.    Objective:   Physical Exam General:  Well-developed, well-nourished and in no acute distress Eyes:  anicteric. ENT:   Mouth and posterior pharynx free of lesions.  Neck:   supple w/o thyromegaly or mass.  Lungs: Clear to auscultation bilaterally. Heart:  S1S2, no rubs, murmurs, gallops. Abdomen:  soft, non-tender, no hepatosplenomegaly, hernia, or mass and BS+.  Lymph:  no cervical or supraclavicular adenopathy. Extremities:   no edema in the  lower extremities Neuro:  A&O x 3.  Psych:  appropriate mood and  Affect.   Data Reviewed: Chest x-ray, emergency room visit and labs from early October 2013       Assessment & Plan:   1. Chest pain   2. Dysphagia   3. GERD (gastroesophageal reflux disease)    Cause not clear though refractory reflux disease is in the differential diagnosis as is peptic stricture, esophageal spasm. Question asthma overlay.  1. Upper GI endoscopy with possible esophageal dilation will be scheduled. The risks and benefits as well as alternatives of endoscopic procedure(s) have been discussed and reviewed. All questions answered. The patient agrees to proceed. Chew carefully in the interim. Continue current PPI therapy. Further plans pending the results of the endoscopy.

## 2011-12-31 NOTE — Patient Instructions (Addendum)
You have been scheduled for an endoscopy with propofol. Please follow written instructions given to you at your visit today. If you use inhalers (even only as needed), please bring them with you on the day of your procedure.  You have been given a GERD handout today to read and follow.  Try to avoid foods that bother you.  Thank you for choosing me and [facility].  Iva Boop, M.D., [facility]

## 2012-01-04 ENCOUNTER — Telehealth: Payer: Self-pay | Admitting: Internal Medicine

## 2012-01-04 ENCOUNTER — Ambulatory Visit (AMBULATORY_SURGERY_CENTER): Payer: Medicaid Other | Admitting: Internal Medicine

## 2012-01-04 ENCOUNTER — Encounter: Payer: Self-pay | Admitting: Internal Medicine

## 2012-01-04 VITALS — BP 139/94 | HR 72 | Temp 97.6°F | Resp 16 | Ht 72.0 in | Wt 231.0 lb

## 2012-01-04 DIAGNOSIS — R079 Chest pain, unspecified: Secondary | ICD-10-CM

## 2012-01-04 DIAGNOSIS — K209 Esophagitis, unspecified without bleeding: Secondary | ICD-10-CM

## 2012-01-04 DIAGNOSIS — R933 Abnormal findings on diagnostic imaging of other parts of digestive tract: Secondary | ICD-10-CM

## 2012-01-04 DIAGNOSIS — K449 Diaphragmatic hernia without obstruction or gangrene: Secondary | ICD-10-CM

## 2012-01-04 DIAGNOSIS — R1310 Dysphagia, unspecified: Secondary | ICD-10-CM

## 2012-01-04 MED ORDER — SODIUM CHLORIDE 0.9 % IV SOLN: 500.0000 mL | INTRAVENOUS | Status: DC

## 2012-01-04 NOTE — Progress Notes (Signed)
Patient did not experience any of the following events: a burn prior to discharge; a fall within the facility; wrong site/side/patient/procedure/implant event; or a hospital transfer or hospital admission upon discharge from the facility. (G8907) Patient did not have preoperative order for IV antibiotic SSI prophylaxis. (G8918)  

## 2012-01-04 NOTE — Patient Instructions (Addendum)
There was one area of change in the lining of the esophagus that may be barrett's esophagus. Please read the handout. I will let you know about the biopsy results.  I stretched or dilated the esophagus - let's see how you do with this.  Thank you for choosing me and [facility].  Iva Boop, MD, FACG YOU HAD AN ENDOSCOPIC PROCEDURE TODAY AT THE [facility]: Refer to the procedure report that was given to you for any specific questions about what was found during the examination.  If the procedure report does not answer your questions, please call your gastroenterologist to clarify.  If you requested that your care partner not be given the details of your procedure findings, then the procedure report has been included in a sealed envelope for you to review at your convenience later.  YOU SHOULD EXPECT: Some feelings of bloating in the abdomen. Passage of more gas than usual.  Walking can help get rid of the air that was put into your GI tract during the procedure and reduce the bloating. If you had a lower endoscopy (such as a colonoscopy or flexible sigmoidoscopy) you may notice spotting of blood in your stool or on the toilet paper. If you underwent a bowel prep for your procedure, then you may not have a normal bowel movement for a few days.  DIET: Your first meal following the procedure should be a light meal and then it is ok to progress to your normal diet.  A half-sandwich or bowl of soup is an example of a good first meal.  Heavy or fried foods are harder to digest and may make you feel nauseous or bloated.  Likewise meals heavy in dairy and vegetables can cause extra gas to form and this can also increase the bloating.  Drink plenty of fluids but you should avoid alcoholic beverages for 24 hours.  ACTIVITY: Your care partner should take you home directly after the procedure.  You should plan to take it easy, moving slowly for the rest of the day.  You can resume  normal activity the day after the procedure however you should NOT DRIVE or use heavy machinery for 24 hours (because of the sedation medicines used during the test).    SYMPTOMS TO REPORT IMMEDIATELY: A gastroenterologist can be reached at any hour.  During normal business hours, 8:30 AM to 5:00 PM Monday through Friday, call [phone].  After hours and on weekends, please call the GI answering service at [phone] who will take a message and have the physician on call contact you.    Following upper endoscopy (EGD)  Vomiting of blood or coffee ground material  New chest pain or pain under the shoulder blades  Painful or persistently difficult swallowing  New shortness of breath  Fever of 100F or higher  Black, tarry-looking stools  FOLLOW UP: If any biopsies were taken you will be contacted by phone or by letter within the next 1-3 weeks.  Call your gastroenterologist if you have not heard about the biopsies in 3 weeks.  Our staff will call the home number listed on your records the next business day following your procedure to check on you and address any questions or concerns that you may have at that time regarding the information given to you following your procedure. This is a courtesy call and so if there is no answer at the home number and we have not heard from you through the emergency physician  on call, we will assume that you have returned to your regular daily activities without incident.  SIGNATURES/CONFIDENTIALITY: You and/or your care partner have signed paperwork which will be entered into your electronic medical record.  These signatures attest to the fact that that the information above on your After Visit Summary has been reviewed and is understood.  Full responsibility of the confidentiality of this discharge information lies with you and/or your care-partner.  After dilation diet and Barrett's esophagus, and hiatal hernia  information given.

## 2012-01-04 NOTE — Telephone Encounter (Signed)
Pt called to state he is having 6/10 pain under left shoulder blade. Started around 10 pm tonight, is slightly better and bearable when lying on left side. No dyspnea, or other chest pain.   Thinks he "overate" this am after procedure and had an episode of vomiting.  None since.  No nausea now. No fever.  Able to swallow fine.  I am uncertain as to the etiology of the pain.  I reviewed EGD and Barrett's bx done and empiric dilation with [address] Maloney. I have a low suspicion for procedural complication, but I made him aware the only avenue for eval tonight is the ED. He doesn't feel it is severe enough for ED now, but I strongly urged that if the pain does not resolve soon, worsens in any way or should he develop chest pain, dyspnea, nausea/vomiting to seek ED care immediately. He voiced understanding and thanked me for the call.  Office to check on patient in the AM

## 2012-01-04 NOTE — Op Note (Signed)
[facility] [address] [place], [address]   ENDOSCOPY PROCEDURE REPORT  PATIENT: Antonio Sanchez, Antonio Sanchez  MR#: [id number] BIRTHDATE: 02-05-1976 , 35  yrs. old GENDER: Male ENDOSCOPIST: Iva Boop, MD, [facility]  PROCEDURE DATE:  01/04/2012 PROCEDURE:  EGD w/ biopsy ASA CLASS:     Class II INDICATIONS:  chest pain.   dysphagia. MEDICATIONS: propofol (Diprivan) 300mg  IV, MAC sedation, administered by CRNA, and These medications were titrated to patient response per physician's verbal order TOPICAL ANESTHETIC: Cetacaine Spray  DESCRIPTION OF PROCEDURE: After the risks benefits and alternatives of the procedure were thoroughly explained, informed consent was obtained.  The [facility] [id number] endoscope was introduced through the mouth and advanced to the second portion of the duodenum. Without limitations.  The instrument was slowly withdrawn as the mucosa was fully examined.      ESOPHAGUS: There was evidence of suspected Barrett's esophagus at the gastroesophageal junction and 38 cm from the incisors. 3x7 mm tongue (1). Multiple biopsies were performed using cold forceps. Sample sent for histology.   A 2 cm hiatal hernia was noted.  The remainder of the upper endoscopy exam was otherwise normal. Retroflexed views revealed no abnormalities.     The scope was then withdrawn from the patient, a [address] Maloney dilator passed without heme or problems, mild resistance, and the procedure completed.  COMPLICATIONS: There were no complications. ENDOSCOPIC IMPRESSION: 1.   There was evidence of suspected Barrett's esophagus; multiple biopsies 2.   2 cm hiatal hernia 3.   The remainder of the upper endoscopy exam was otherwise normal - [address] Maloney dilator passed to help symptoms  RECOMMENDATIONS: 1.  Clear liquids until 1000, then soft foods rest iof day.  Resume prior diet tomorrow. 2.  Office will call with results and plans    eSigned:  Iva Boop, MD, [facility] 01/04/2012 8:54 AM  CC:The Patient

## 2012-01-05 ENCOUNTER — Telehealth: Payer: Self-pay

## 2012-01-05 ENCOUNTER — Emergency Department (HOSPITAL_COMMUNITY)
Admission: EM | Admit: 2012-01-05 | Discharge: 2012-01-05 | Disposition: A | Discharge status: Home / Self Care | Payer: Medicaid Other | Attending: Emergency Medicine | Admitting: Emergency Medicine

## 2012-01-05 ENCOUNTER — Encounter (HOSPITAL_COMMUNITY): Payer: Self-pay

## 2012-01-05 ENCOUNTER — Emergency Department (HOSPITAL_COMMUNITY): Payer: Medicaid Other

## 2012-01-05 DIAGNOSIS — K219 Gastro-esophageal reflux disease without esophagitis: Secondary | ICD-10-CM | POA: Insufficient documentation

## 2012-01-05 DIAGNOSIS — F329 Major depressive disorder, single episode, unspecified: Secondary | ICD-10-CM | POA: Insufficient documentation

## 2012-01-05 DIAGNOSIS — M549 Dorsalgia, unspecified: Secondary | ICD-10-CM | POA: Insufficient documentation

## 2012-01-05 DIAGNOSIS — Z79899 Other long term (current) drug therapy: Secondary | ICD-10-CM | POA: Insufficient documentation

## 2012-01-05 DIAGNOSIS — E559 Vitamin D deficiency, unspecified: Secondary | ICD-10-CM | POA: Insufficient documentation

## 2012-01-05 DIAGNOSIS — R51 Headache: Secondary | ICD-10-CM | POA: Insufficient documentation

## 2012-01-05 DIAGNOSIS — F3289 Other specified depressive episodes: Secondary | ICD-10-CM | POA: Insufficient documentation

## 2012-01-05 DIAGNOSIS — Z87891 Personal history of nicotine dependence: Secondary | ICD-10-CM | POA: Insufficient documentation

## 2012-01-05 DIAGNOSIS — J45909 Unspecified asthma, uncomplicated: Secondary | ICD-10-CM | POA: Insufficient documentation

## 2012-01-05 DIAGNOSIS — E669 Obesity, unspecified: Secondary | ICD-10-CM | POA: Insufficient documentation

## 2012-01-05 MED ORDER — TRAMADOL HCL 50 MG PO TABS: 50.0000 mg | ORAL_TABLET | Freq: Four times a day (QID) | ORAL | Status: DC | PRN

## 2012-01-05 NOTE — ED Notes (Signed)
Pt sts he had an endoscopy yesterday AM to have esophagus stretched.  Sts pain in back started at  2200 last night.

## 2012-01-05 NOTE — Telephone Encounter (Signed)
  Follow up Call-  Call back number 01/04/2012  Post procedure Call Back phone  # [phone]  Permission to leave phone message Yes     Patient questions:  Do you have a fever, pain , or abdominal swelling? no Pain Score  0 *  Have you tolerated food without any problems? yes  Have you been able to return to your normal activities? yes  Do you have any questions about your discharge instructions: Diet   no Medications  no Follow up visit  no  Do you have questions or concerns about your Care? no  Actions: * If pain score is 4 or above: No action needed, pain <4. PATIENT STATES NO DIFFICULTY SWALLOWING,NO PROBLEM BREATHING NO FEVER. PAIN AROUND SHOULDER BLADE INSTRUCTED TO TAKE TYLENOL FOR DISCOMFORT AND CALL BACK IF NEEDED.   2

## 2012-01-05 NOTE — Telephone Encounter (Signed)
Pt informed per Dr Leone Payor he should see urgent care or ED due to the s/s he has because he feels not related to the procedure yesterday  or the dilation. Pt verbalized understanding of instructions given.

## 2012-01-05 NOTE — ED Notes (Signed)
Pt presents with NAD- Pt  C/o of left upper back and shoulder blade pain since 10pm last night. Denies N/V/d and fever- pain pill oxycodone taken last night made me sleep but did not control pain.

## 2012-01-05 NOTE — Telephone Encounter (Signed)
Pt said at am phone call he was having pain under left shoulder blade, no difficulty breathing and no edema. No bleeding. He called back now saying When he tries to lay down or he coughs that's when he has sharp pain.  He had pain med with no relief today. He has been able to eat and drink fine. He just wanted to inform us of these symptoms and his discomfort.   Pain scale is 7/10.  Said here yesterday for dysphagia and chest pain but today says this pain is different. Please advise. Thanks,  [employer]

## 2012-01-05 NOTE — Telephone Encounter (Signed)
This does not sound like a problem from the dilation - think esophagus is ok but given the problems he should go to an urgent care or ED to be evaluated

## 2012-01-12 ENCOUNTER — Other Ambulatory Visit: Payer: Self-pay

## 2012-01-12 MED ORDER — ESOMEPRAZOLE MAGNESIUM 40 MG PO CPDR: 40.0000 mg | DELAYED_RELEASE_CAPSULE | Freq: Every day | ORAL | Status: DC

## 2012-01-12 NOTE — ED Provider Notes (Cosign Needed)
History     CSN: [id number]  Arrival date & time 01/05/12  1336   First MD Initiated Contact with Patient 01/05/12 1511      Chief Complaint  Patient presents with  . Back Pain  . Endoscopy    yesterday with [place] by Leone Payor MD    (Consider location/radiation/quality/duration/timing/severity/associated sxs/prior treatment) Patient is a 36 y.o. male presenting with back pain. The history is provided by the patient (the pt has back pain). No language interpreter was used.  Back Pain  This is a new problem. The current episode started 2 days ago. The problem occurs constantly. The problem has not changed since onset.The pain is associated with no known injury. The pain is present in the thoracic spine. The quality of the pain is described as aching. The pain does not radiate. The pain is at a severity of 3/10. The pain is moderate. The symptoms are aggravated by bending. Pertinent negatives include no chest pain, no headaches and no abdominal pain.    Past Medical History  Diagnosis Date  . Acid reflux   . Headache   . Asthma   . GERD (gastroesophageal reflux disease)   . Corneal abrasion, left   . Allergic rhinitis   . Depression   . Vitamin D deficiency   . Obesity     Past Surgical History  Procedure Date  . Wisdom tooth extraction   . Dilated esophagus     Family History  Problem Relation Age of Onset  . Diabetes Other     Both sides  . Diabetes Mother     History  Substance Use Topics  . Smoking status: Former Games developer  . Smokeless tobacco: Never Used  . Alcohol Use: No     social      Review of Systems  Constitutional: Negative for fatigue.  HENT: Negative for congestion, sinus pressure and ear discharge.   Eyes: Negative for discharge.  Respiratory: Negative for cough.   Cardiovascular: Negative for chest pain.  Gastrointestinal: Negative for abdominal pain and diarrhea.  Genitourinary: Negative for frequency and hematuria.  Musculoskeletal:  Positive for back pain.  Skin: Negative for rash.  Neurological: Negative for seizures and headaches.  Hematological: Negative.   Psychiatric/Behavioral: Negative for hallucinations.    Allergies  Review of patient's allergies indicates no known allergies.  Home Medications   Current Outpatient Rx  Name Route Sig Dispense Refill  . ALBUTEROL SULFATE HFA 108 (90 BASE) MCG/ACT IN AERS Inhalation Inhale 2 puffs into the lungs every 6 (six) hours as needed. For shortness of breath.    . FAMOTIDINE 10 MG PO TABS Oral Take 10 mg by mouth 2 (two) times daily as needed. For indigestion.    . OXYCODONE-ACETAMINOPHEN 5-325 MG PO TABS Oral Take 1 tablet by mouth every 4 (four) hours as needed. For pain.    Marland Kitchen PANTOPRAZOLE SODIUM 40 MG PO TBEC Oral Take 40 mg by mouth daily.    Marland Kitchen ESOMEPRAZOLE MAGNESIUM 40 MG PO CPDR Oral Take 1 capsule (40 mg total) by mouth daily before breakfast. 30 capsule 11  . TRAMADOL HCL 50 MG PO TABS Oral Take 1 tablet (50 mg total) by mouth every 6 (six) hours as needed for pain. 20 tablet 0    BP 149/105  Pulse 78  Temp 98.8 F (37.1 C) (Oral)  Resp 18  Ht 6' (1.829 m)  Wt 231 lb (104.781 kg)  BMI 31.33 kg/m2  SpO2 100%  Physical Exam  Constitutional: He is  oriented to person, place, and time. He appears well-developed.  HENT:  Head: Normocephalic and atraumatic.  Eyes: Conjunctivae normal and EOM are normal. No scleral icterus.  Neck: Neck supple. No thyromegaly present.  Cardiovascular: Normal rate and regular rhythm.  Exam reveals no gallop and no friction rub.   No murmur heard. Pulmonary/Chest: No stridor. He has no wheezes. He has no rales. He exhibits no tenderness.  Abdominal: He exhibits no distension. There is no tenderness. There is no rebound.  Musculoskeletal: Normal range of motion. He exhibits no edema.       Tender left upper back  Lymphadenopathy:    He has no cervical adenopathy.  Neurological: He is oriented to person, place, and time.  Coordination normal.  Skin: No rash noted. No erythema.  Psychiatric: He has a normal mood and affect. His behavior is normal.    ED Course  Procedures (including critical care time)  Labs Reviewed - No data to display No results found.   No diagnosis found.    MDM          Benny Lennert, MD 01/12/12 [phone]

## 2012-01-13 ENCOUNTER — Telehealth: Payer: Self-pay

## 2012-01-13 NOTE — Telephone Encounter (Signed)
Spoke with Amy at [facility] Trax # [phone] to get prior authorization for patients Nexium 40mg .  PA case # [id number].  She said a decision will be made in 24-48 hours.

## 2012-01-19 ENCOUNTER — Telehealth: Payer: Self-pay

## 2012-01-19 MED ORDER — OMEPRAZOLE 20 MG PO CPDR: 20.0000 mg | DELAYED_RELEASE_CAPSULE | Freq: Every day | ORAL | Status: DC

## 2012-01-19 NOTE — Telephone Encounter (Signed)
Spoke to mom who is going to tell [facility] that his insurance won't cover the Nexium so we are putting samples of Prilosec up front for him to try.  He will let us know if this helps.

## 2012-01-27 ENCOUNTER — Telehealth: Payer: Self-pay

## 2012-01-27 NOTE — Telephone Encounter (Signed)
Spoke with [facility] and informed him to take the omeprazole samples he was given early Nov. One twice a day per Dr. Leone Payor not once a day .  He said he has not started the samples yet, he is still taking the pantoprazole he has been on.  He will let us know if the omeprazole helps once he starts it.

## 2012-02-19 ENCOUNTER — Telehealth: Payer: Self-pay | Admitting: Internal Medicine

## 2012-02-19 MED ORDER — OMEPRAZOLE 40 MG PO CPDR: 40.0000 mg | DELAYED_RELEASE_CAPSULE | Freq: Every day | ORAL | Status: DC

## 2012-02-19 NOTE — Telephone Encounter (Signed)
Needs omeprazole Rx  I sent it  Please call him Monday 12/9 to let him know  He still has samples

## 2012-02-21 NOTE — Telephone Encounter (Signed)
Patient informed generic Prilosec sent in via Dr. Leone Payor 02/19/12 to [facility] - [address]  He said thank you.

## 2014-03-08 IMAGING — CR DG CHEST 2V
2 series · 2 of 2 positions shown · non-contrast
Comparison: 07/12/2008

CLINICAL DATA: Chest pain, shortness of breath, cough

CHEST - 2 VIEW

[w chest pa]
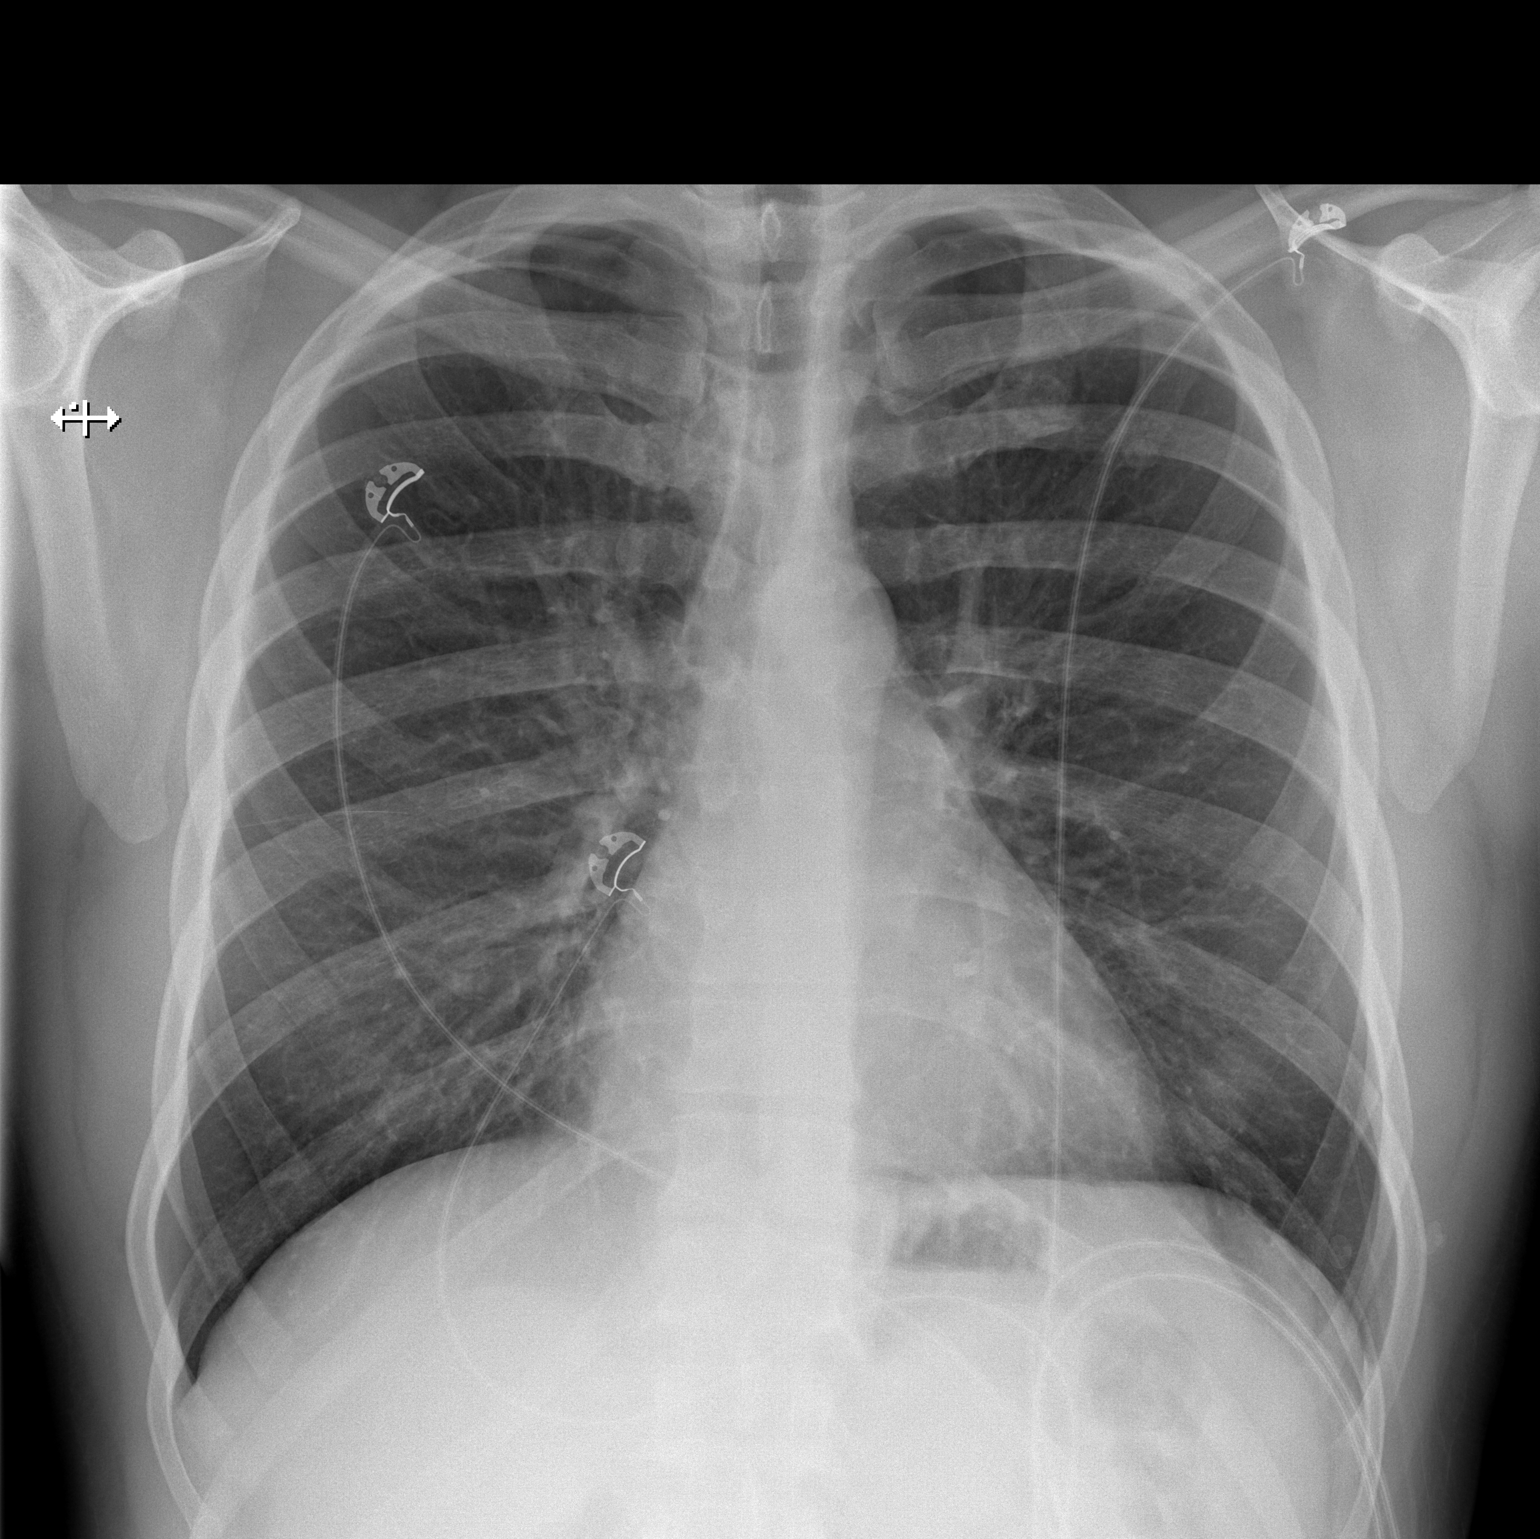

[w chest lat]
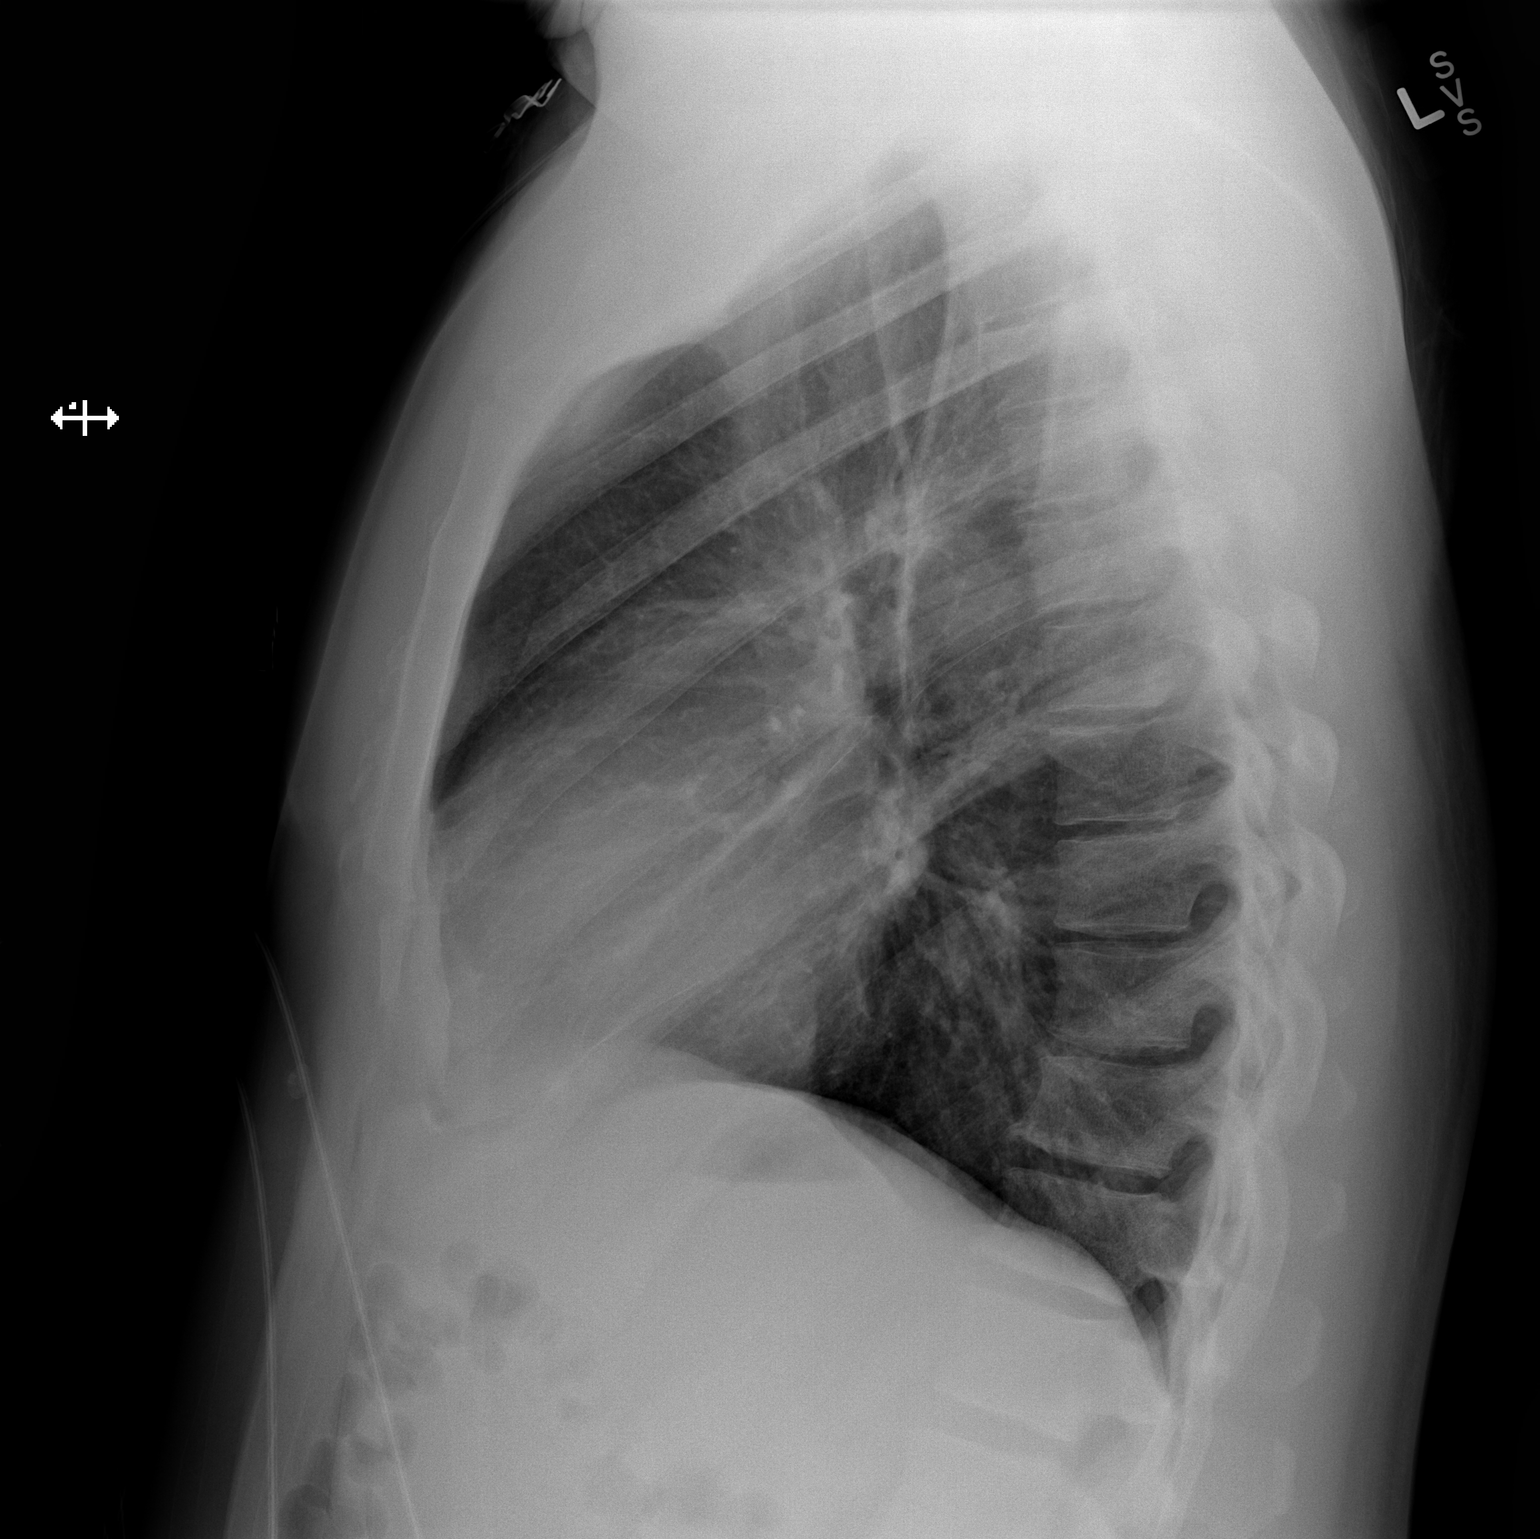

[2 of 2 positions shown; findings below may reference images not displayed]

FINDINGS: Unchanged cardiac silhouette and mediastinal contours.
No focal parenchymal opacities.  No pleural effusion or
pneumothorax.  Unchanged bones.
IMPRESSION: No acute cardiopulmonary disease.  Specifically, no evidence of
pneumonia.

## 2016-03-24 ENCOUNTER — Emergency Department (HOSPITAL_COMMUNITY)
Admission: EM | Admit: 2016-03-24 | Discharge: 2016-03-24 | Disposition: A | Discharge status: Home / Self Care | Payer: BLUE CROSS/BLUE SHIELD | Attending: Emergency Medicine | Admitting: Emergency Medicine

## 2016-03-24 ENCOUNTER — Encounter (HOSPITAL_COMMUNITY): Payer: Self-pay | Admitting: *Deleted

## 2016-03-24 ENCOUNTER — Emergency Department (HOSPITAL_COMMUNITY): Payer: BLUE CROSS/BLUE SHIELD

## 2016-03-24 DIAGNOSIS — R0789 Other chest pain: Secondary | ICD-10-CM | POA: Insufficient documentation

## 2016-03-24 DIAGNOSIS — Z87891 Personal history of nicotine dependence: Secondary | ICD-10-CM | POA: Insufficient documentation

## 2016-03-24 DIAGNOSIS — Z79899 Other long term (current) drug therapy: Secondary | ICD-10-CM | POA: Insufficient documentation

## 2016-03-24 DIAGNOSIS — J45909 Unspecified asthma, uncomplicated: Secondary | ICD-10-CM | POA: Diagnosis not present

## 2016-03-24 LAB — BASIC METABOLIC PANEL
Anion gap: 6 (ref 5–15)
BUN: 10 mg/dL (ref 6–20)
CO2: 27 mmol/L (ref 22–32)
Calcium: 9.2 mg/dL (ref 8.9–10.3)
Chloride: 107 mmol/L (ref 101–111)
Creatinine, Ser: 1.04 mg/dL (ref 0.61–1.24)
GFR calc Af Amer: >60 mL/min (ref >60)
GFR calc non Af Amer: >60 mL/min (ref >60)
Glucose, Bld: 110 mg/dL — ABNORMAL HIGH (ref 65–99)
Potassium: 3.6 mmol/L (ref 3.5–5.1)
Sodium: 140 mmol/L (ref 135–145)

## 2016-03-24 LAB — I-STAT TROPONIN, ED
TROPONIN I, POC: 0 ng/mL (ref 0.00–0.08)
Troponin i, poc: 0 ng/mL (ref 0.00–0.08)

## 2016-03-24 LAB — CBC
HCT: 46.4 % (ref 39.0–52.0)
Hemoglobin: 16.2 g/dL (ref 13.0–17.0)
MCH: 30.2 pg (ref 26.0–34.0)
MCHC: 34.9 g/dL (ref 30.0–36.0)
MCV: 86.6 fL (ref 78.0–100.0)
Platelets: 162 10*3/uL (ref 150–400)
RBC: 5.36 MIL/uL (ref 4.22–5.81)
RDW: 12.7 % (ref 11.5–15.5)
WBC: 4.4 10*3/uL (ref 4.0–10.5)

## 2016-03-24 MED ORDER — ALPRAZOLAM 0.5 MG PO TABS: 0.5000 mg | ORAL_TABLET | Freq: Every evening | ORAL | 0 refills | Status: DC | PRN

## 2016-03-24 NOTE — ED Provider Notes (Signed)
[facility] DEPT Provider Note   CSN: [id number] Arrival date & time: 03/24/16  4098     History   Chief Complaint Chief Complaint  Patient presents with  . Chest Pain    HPI [facility] is a 41 y.o. male.  The history is provided by the patient. No language interpreter was used.  Chest Pain   This is a new problem. The problem occurs constantly. The pain is associated with rest. The pain is present in the epigastric region. The pain is moderate. The quality of the pain is described as pressure-like. The pain does not radiate. Duration of episode(s) is 2 days. The symptoms are aggravated by certain positions. Risk factors include male gender and stress.  Pertinent negatives for past medical history include no MI.  His family medical history is significant for heart disease.   Pt reports increased stress.  Pt reports he had an argument with wife and has had increased stress since.  Pt reports when he lays down he feels like his heart is skipping a beat.  Past Medical History:  Diagnosis Date  . Acid reflux   . Allergic rhinitis   . Asthma   . Corneal abrasion, left   . Depression   . GERD (gastroesophageal reflux disease)   . Headache(784.0)   . Obesity   . Vitamin D deficiency     Patient Active Problem List   Diagnosis Date Noted  . VITAMIN D DEFICIENCY 06/23/2009  . DEPRESSION 06/23/2009  . ALLERGIC RHINITIS 06/23/2009  . ASTHMA 06/23/2009  . OBESITY, UNSPECIFIED 01/24/2009  . GERD, SEVERE 01/24/2009    Past Surgical History:  Procedure Laterality Date  . dilated esophagus    . WISDOM TOOTH EXTRACTION         Home Medications    Prior to Admission medications   Medication Sig Start Date End Date Taking? Authorizing Provider  albuterol (PROVENTIL HFA;VENTOLIN HFA) 108 (90 BASE) MCG/ACT inhaler Inhale 2 puffs into the lungs every 6 (six) hours as needed. For shortness of breath.   Yes Historical Provider, MD  omeprazole (PRILOSEC) 40 MG capsule Take  1 capsule (40 mg total) by mouth daily before breakfast. 02/19/12  Yes Gatha Mayer, MD  traMADol (ULTRAM) 50 MG tablet Take 1 tablet (50 mg total) by mouth every 6 (six) hours as needed for pain. Patient not taking: Reported on 03/24/2016 01/05/12   Milton Ferguson, MD    Family History Family History  Problem Relation Age of Onset  . Diabetes Mother   . Diabetes Other     Both sides    Social History Social History  Substance Use Topics  . Smoking status: Former Research scientist (life sciences)  . Smokeless tobacco: Never Used  . Alcohol use No     Comment: social     Allergies   Patient has no known allergies.   Review of Systems Review of Systems  Cardiovascular: Positive for chest pain.  All other systems reviewed and are negative.    Physical Exam Updated Vital Signs BP 145/96   Pulse 71   Temp 97.6 F (36.4 C) (Oral)   Resp 16   Ht 6' (1.829 m)   Wt 104.3 kg   SpO2 100%   BMI 31.19 kg/m   Physical Exam  Constitutional: He appears well-developed and well-nourished.  HENT:  Head: Normocephalic and atraumatic.  Right Ear: External ear normal.  Left Ear: External ear normal.  Nose: Nose normal.  Mouth/Throat: Oropharynx is clear and moist.  Eyes: Conjunctivae  are normal.  Neck: Neck supple.  Cardiovascular: Normal rate, regular rhythm and normal heart sounds.   No murmur heard. Pulmonary/Chest: Effort normal and breath sounds normal. No respiratory distress.  Abdominal: Soft. There is no tenderness.  Musculoskeletal: Normal range of motion. He exhibits no edema.  Neurological: He is alert.  Skin: Skin is warm and dry.  Psychiatric: He has a normal mood and affect.  Nursing note and vitals reviewed.    ED Treatments / Results  Labs (all labs ordered are listed, but only abnormal results are displayed) Labs Reviewed  BASIC METABOLIC PANEL - Abnormal; Notable for the following:       Result Value   Glucose, Bld 110 (*)    All other components within normal limits  CBC   I-STAT TROPOININ, ED  I-STAT TROPOININ, ED    EKG  EKG Interpretation  Date/Time:  Wednesday March 24 2016 08:23:14 EST Ventricular Rate:  72 PR Interval:  152 QRS Duration: 96 QT Interval:  372 QTC Calculation: 407 R Axis:   89 Text Interpretation:  Normal sinus rhythm Early repolarization No significant change since last tracing Confirmed by [place]  MD, [place] [phone]) on 03/24/2016 10:54:21 AM       Radiology Dg Chest 2 View  Result Date: 03/24/2016 CLINICAL DATA:  Upper chest pain and pressure with shortness of breath for about 2 days following stress, history smoking, hypertension, asthma ; patient's electronic health record is not available at time of interpretation EXAM: CHEST  2 VIEW COMPARISON:  01/05/2012 FINDINGS: Normal heart size, mediastinal contours, and pulmonary vascularity. Lungs clear. No pleural effusion or pneumothorax. Osseous structures normal. IMPRESSION: Normal exam. Electronically Signed   By: Lavonia Dana M.D.   On: 03/24/2016 08:40    Procedures Procedures (including critical care time)  Medications Ordered in ED Medications - No data to display   Initial Impression / Assessment and Plan / ED Course  I have reviewed the triage vital signs and the nursing notes.  Pertinent labs & imaging results that were available during my care of the patient were reviewed by me and considered in my medical decision making (see chart for details).  Clinical Course     Troponin is negative x 2  Monitor,  No acute abnormality I suspect anxiety.   Pt advised to follow up with his Md for recheck.     Final Clinical Impressions(s) / ED Diagnoses   Final diagnoses:  Other chest pain    New Prescriptions New Prescriptions   ALPRAZOLAM (XANAX) 0.5 MG TABLET    Take 1 tablet (0.5 mg total) by mouth at bedtime as needed for anxiety.     Hollace Kinnier Pattison, PA-C 03/24/16 [place], MD 03/26/16 [phone]

## 2016-03-24 NOTE — ED Triage Notes (Signed)
Pt reports chest pain , SOB and headache for 2 days. Pt states that pain is worse when flat. Pt reports nausea and vomiting today. Pt states that he has had some congestion as well. Reports hx of igh BP as well.

## 2016-03-24 NOTE — ED Notes (Signed)
Lab at bedside

## 2016-03-24 NOTE — Discharge Instructions (Signed)
Return if any problems.  See your Physician for recheck in 1 week

## 2018-08-03 ENCOUNTER — Other Ambulatory Visit: Payer: Self-pay

## 2018-08-03 ENCOUNTER — Emergency Department (HOSPITAL_COMMUNITY): Payer: BLUE CROSS/BLUE SHIELD

## 2018-08-03 ENCOUNTER — Emergency Department (HOSPITAL_COMMUNITY)
Admission: EM | Admit: 2018-08-03 | Discharge: 2018-08-03 | Disposition: A | Discharge status: Home / Self Care | Payer: BLUE CROSS/BLUE SHIELD | Attending: Emergency Medicine | Admitting: Emergency Medicine

## 2018-08-03 ENCOUNTER — Encounter (HOSPITAL_COMMUNITY): Payer: Self-pay | Admitting: Emergency Medicine

## 2018-08-03 DIAGNOSIS — R0789 Other chest pain: Secondary | ICD-10-CM | POA: Diagnosis present

## 2018-08-03 DIAGNOSIS — I10 Essential (primary) hypertension: Secondary | ICD-10-CM | POA: Diagnosis not present

## 2018-08-03 DIAGNOSIS — Z87891 Personal history of nicotine dependence: Secondary | ICD-10-CM | POA: Diagnosis not present

## 2018-08-03 DIAGNOSIS — R110 Nausea: Secondary | ICD-10-CM | POA: Insufficient documentation

## 2018-08-03 DIAGNOSIS — J45909 Unspecified asthma, uncomplicated: Secondary | ICD-10-CM | POA: Diagnosis not present

## 2018-08-03 DIAGNOSIS — Z79899 Other long term (current) drug therapy: Secondary | ICD-10-CM | POA: Diagnosis not present

## 2018-08-03 DIAGNOSIS — R1013 Epigastric pain: Secondary | ICD-10-CM | POA: Diagnosis not present

## 2018-08-03 DIAGNOSIS — R197 Diarrhea, unspecified: Secondary | ICD-10-CM | POA: Diagnosis not present

## 2018-08-03 DIAGNOSIS — R142 Eructation: Secondary | ICD-10-CM | POA: Insufficient documentation

## 2018-08-03 LAB — BASIC METABOLIC PANEL
Anion gap: 15 (ref 5–15)
BUN: 12 mg/dL (ref 6–20)
CO2: 17 mmol/L — ABNORMAL LOW (ref 22–32)
Calcium: 9.7 mg/dL (ref 8.9–10.3)
Chloride: 107 mmol/L (ref 98–111)
Creatinine, Ser: 1.19 mg/dL (ref 0.61–1.24)
GFR calc Af Amer: >60 mL/min (ref >60)
GFR calc non Af Amer: >60 mL/min (ref >60)
Glucose, Bld: 114 mg/dL — ABNORMAL HIGH (ref 70–99)
Potassium: 3.8 mmol/L (ref 3.5–5.1)
Sodium: 139 mmol/L (ref 135–145)

## 2018-08-03 LAB — CBC
HCT: 48.1 % (ref 39.0–52.0)
Hemoglobin: 16.9 g/dL (ref 13.0–17.0)
MCH: 30 pg (ref 26.0–34.0)
MCHC: 35.1 g/dL (ref 30.0–36.0)
MCV: 85.3 fL (ref 80.0–100.0)
Platelets: 239 10*3/uL (ref 150–400)
RBC: 5.64 MIL/uL (ref 4.22–5.81)
RDW: 11.2 % — ABNORMAL LOW (ref 11.5–15.5)
WBC: 4.3 10*3/uL (ref 4.0–10.5)
nRBC: 0 % (ref 0.0–0.2)

## 2018-08-03 LAB — LIPASE, BLOOD: Lipase: 30 U/L (ref 11–51)

## 2018-08-03 LAB — HEPATIC FUNCTION PANEL
ALT: 52 U/L — ABNORMAL HIGH (ref 0–44)
AST: 36 U/L (ref 15–41)
Albumin: 4.5 g/dL (ref 3.5–5.0)
Alkaline Phosphatase: 60 U/L (ref 38–126)
Bilirubin, Direct: 0.3 mg/dL — ABNORMAL HIGH (ref 0.0–0.2)
Indirect Bilirubin: 1 mg/dL — ABNORMAL HIGH (ref 0.3–0.9)
Total Bilirubin: 1.3 mg/dL — ABNORMAL HIGH (ref 0.3–1.2)
Total Protein: 7.1 g/dL (ref 6.5–8.1)

## 2018-08-03 LAB — TROPONIN I
Troponin I: <0.03 ng/mL (ref <0.03)
Troponin I: <0.03 ng/mL (ref <0.03)

## 2018-08-03 MED ORDER — ALUM & MAG HYDROXIDE-SIMETH 200-200-20 MG/5ML PO SUSP
30.0000 mL | Freq: Once | ORAL | Status: AC
  Administered 2018-08-03: 18:00:00 30 mL via ORAL
  Filled 2018-08-03: qty 30

## 2018-08-03 MED ORDER — LIDOCAINE VISCOUS HCL 2 % MT SOLN
15.0000 mL | Freq: Once | OROMUCOSAL | Status: AC
  Administered 2018-08-03: 15 mL via ORAL
  Filled 2018-08-03: qty 15

## 2018-08-03 MED ORDER — SODIUM CHLORIDE 0.9 % IV BOLUS
500.0000 mL | Freq: Once | INTRAVENOUS | Status: AC
  Administered 2018-08-03: 18:00:00 500 mL via INTRAVENOUS

## 2018-08-03 MED ORDER — SODIUM CHLORIDE 0.9% FLUSH: 3.0000 mL | Freq: Once | INTRAVENOUS | Status: DC

## 2018-08-03 NOTE — ED Triage Notes (Signed)
Pt has acid reflux, reports worsening burping and burning with chest pressure x 2 days. Reports feeling "jittery".

## 2018-08-03 NOTE — ED Provider Notes (Signed)
[facility] Provider Note   CSN: [id number] Arrival date & time: 08/03/18  1621    History   Chief Complaint Chief Complaint  Patient presents with  . Chest Pain    HPI [facility] is a 43 y.o. male.     The history is provided by the patient and medical records. No language interpreter was used.  Chest Pain  Pain location:  Epigastric and substernal area Pain quality: aching and burning   Pain radiates to:  Epigastrium Pain severity:  Mild Onset quality:  Gradual Duration:  3 days Timing:  Constant Progression:  Waxing and waning Chronicity:  Recurrent Context: eating   Relieved by:  Nothing Worsened by:  Certain positions Ineffective treatments:  None tried Associated symptoms: abdominal pain (epigastric after eating), heartburn and nausea   Associated symptoms: no back pain, no cough, no diaphoresis, no dizziness, no fatigue, no fever, no headache, no lower extremity edema, no near-syncope, no palpitations, no shortness of breath, no syncope and no vomiting   Risk factors: hypertension, male sex and obesity   Risk factors: no prior DVT/PE, no smoking and no surgery     Past Medical History:  Diagnosis Date  . Acid reflux   . Allergic rhinitis   . Asthma   . Corneal abrasion, left   . Depression   . GERD (gastroesophageal reflux disease)   . Headache(784.0)   . Obesity   . Vitamin D deficiency     Patient Active Problem List   Diagnosis Date Noted  . VITAMIN D DEFICIENCY 06/23/2009  . DEPRESSION 06/23/2009  . ALLERGIC RHINITIS 06/23/2009  . ASTHMA 06/23/2009  . OBESITY, UNSPECIFIED 01/24/2009  . GERD, SEVERE 01/24/2009    Past Surgical History:  Procedure Laterality Date  . dilated esophagus    . WISDOM TOOTH EXTRACTION          Home Medications    Prior to Admission medications   Medication Sig Start Date End Date Taking? Authorizing Provider  albuterol (PROVENTIL HFA;VENTOLIN HFA) 108 (90 BASE)  MCG/ACT inhaler Inhale 2 puffs into the lungs every 6 (six) hours as needed. For shortness of breath.    [provider]  ALPRAZolam Duanne Moron) 0.5 MG tablet Take 1 tablet (0.5 mg total) by mouth at bedtime as needed for anxiety. 03/24/16   Fransico Meadow, PA-C  omeprazole (PRILOSEC) 40 MG capsule Take 1 capsule (40 mg total) by mouth daily before breakfast. 02/19/12   Gatha Mayer, MD  traMADol (ULTRAM) 50 MG tablet Take 1 tablet (50 mg total) by mouth every 6 (six) hours as needed for pain. Patient not taking: Reported on 03/24/2016 01/05/12   Milton Ferguson, MD    Family History Family History  Problem Relation Age of Onset  . Diabetes Mother   . Diabetes Other        Both sides    Social History Social History   Tobacco Use  . Smoking status: Former Research scientist (life sciences)  . Smokeless tobacco: Never Used  Substance Use Topics  . Alcohol use: No    Comment: social  . Drug use: Yes    Types: Marijuana    Comment: occ     Allergies   Patient has no known allergies.   Review of Systems Review of Systems  Constitutional: Negative for chills, diaphoresis, fatigue and fever.  HENT: Negative for congestion.   Respiratory: Negative for cough, choking, chest tightness, shortness of breath and wheezing.   Cardiovascular: Positive for chest pain.  Negative for palpitations, leg swelling, syncope and near-syncope.  Gastrointestinal: Positive for abdominal pain (epigastric after eating), diarrhea, heartburn and nausea. Negative for constipation and vomiting.  Musculoskeletal: Negative for back pain, neck pain and neck stiffness.  Neurological: Negative for dizziness and headaches.  All other systems reviewed and are negative.    Physical Exam Updated Vital Signs BP (!) 143/103 (BP Location: Right Arm)   Pulse 90   Temp 98 F (36.7 C) (Oral)   Resp 16   SpO2 98%   Physical Exam Vitals signs and nursing note reviewed.  Constitutional:      General: He is not in acute distress.     Appearance: He is well-developed. He is not ill-appearing, toxic-appearing or diaphoretic.  HENT:     Head: Normocephalic and atraumatic.  Eyes:     Conjunctiva/sclera: Conjunctivae normal.     Pupils: Pupils are equal, round, and reactive to light.  Neck:     Musculoskeletal: Neck supple.  Cardiovascular:     Rate and Rhythm: Normal rate and regular rhythm.     Heart sounds: Normal heart sounds. No murmur. No systolic murmur. No diastolic murmur.  Pulmonary:     Effort: Pulmonary effort is normal. No tachypnea or respiratory distress.     Breath sounds: Normal breath sounds. No decreased breath sounds, wheezing, rhonchi or rales.  Chest:     Chest wall: No tenderness.  Abdominal:     Palpations: Abdomen is soft.     Tenderness: There is no abdominal tenderness.  Musculoskeletal:     Right lower leg: He exhibits no tenderness. No edema.     Left lower leg: He exhibits no tenderness. No edema.  Skin:    General: Skin is warm and dry.     Capillary Refill: Capillary refill takes less than 2 seconds.     Findings: No rash.  Neurological:     General: No focal deficit present.     Mental Status: He is alert.  Psychiatric:        Mood and Affect: Mood normal.      ED Treatments / Results  Labs (all labs ordered are listed, but only abnormal results are displayed) Labs Reviewed  BASIC METABOLIC PANEL - Abnormal; Notable for the following components:      Result Value   CO2 17 (*)    Glucose, Bld 114 (*)    All other components within normal limits  CBC - Abnormal; Notable for the following components:   RDW 11.2 (*)    All other components within normal limits  HEPATIC FUNCTION PANEL - Abnormal; Notable for the following components:   ALT 52 (*)    Total Bilirubin 1.3 (*)    Bilirubin, Direct 0.3 (*)    Indirect Bilirubin 1.0 (*)    All other components within normal limits  TROPONIN I  LIPASE, BLOOD  TROPONIN I    EKG EKG Interpretation  Date/Time:  Thursday  Aug 03 2018 16:30:07 EDT Ventricular Rate:  96 PR Interval:  132 QRS Duration: 90 QT Interval:  344 QTC Calculation: 434 R Axis:   91 Text Interpretation:  Normal sinus rhythm with sinus arrhythmia Rightward axis Borderline ECG When compared to prior, slightly sharper t waves.  no STEMI Confirmed by Antony Blackbird [phone]) on 08/03/2018 5:07:51 PM   Radiology Dg Chest 2 View  Result Date: 08/03/2018 CLINICAL DATA:  Chest pain EXAM: CHEST - 2 VIEW COMPARISON:  03/24/2016 FINDINGS: The heart size and mediastinal contours are within  normal limits. Both lungs are clear. Mild degenerative changes of the spine. IMPRESSION: No active cardiopulmonary disease. Electronically Signed   By: Donavan Foil M.D.   On: 08/03/2018 17:33    Procedures Procedures (including critical care time)  Medications Ordered in ED Medications  sodium chloride flush (NS) 0.9 % injection 3 mL (3 mLs Intravenous Not Given 08/03/18 1717)  alum & mag hydroxide-simeth (MAALOX/MYLANTA) 200-200-20 MG/5ML suspension 30 mL (30 mLs Oral Given 08/03/18 1746)    And  lidocaine (XYLOCAINE) 2 % viscous mouth solution 15 mL (15 mLs Oral Given 08/03/18 1746)  sodium chloride 0.9 % bolus 500 mL (0 mLs Intravenous Stopped 08/03/18 1952)     Initial Impression / Assessment and Plan / ED Course  I have reviewed the triage vital signs and the nursing notes.  Pertinent labs & imaging results that were available during my care of the patient were reviewed by me and considered in my medical decision making (see chart for details).        Loris Walls is a 43 y.o. male with a past medical history significant for hypertension and GERD who presents with chest discomfort.  Patient reports that his symptoms of been ongoing for the last few days.  He reports it feels like prior GERD in the past but is slightly worsened.  He does report he has been increasing his drinking over the last week but is only drinking around 1 beer a day for the last  few days.  He reports no trauma.  He denies any fevers, chills, congestion, or cough.  He denies shortness of breath with it.  No diaphoresis or emesis but does report some burping and nausea.  He reports she has been on his Prilosec and blood pressure medicine as directed.  He reports the pain is worsened when he lays flat and he denies any leg pain or leg swelling.  No history of DVT or PE.  No family history of early heart disease by report.  No other complaints on arrival.  On exam, chest is nontender.  Abdomen is nontender.  No murmur.  Lungs are clear.  Back and CVA areas nontender.  Legs nontender nonedematous.  Patient resting comfortably with reassuring vital signs on my initial evaluation.  EKG shows no STEMI, slightly more increased sharpness of T waves which he has had in the past.  PERC negative, doubt PE.  Heart score is a 3, if delta troponin is negative, he is a low risk for major adverse cardiac event.  Suspect GERD as a cause of his symptoms likely with mild alcoholic gastritis given the increase in alcohol recently.  Patient was given a GI cocktail and will await diagnostic work-up.  Anticipate discharge home with GI follow-up.  He will be given a small amount of fluids for his reported loose stools and feeling a little dehydrated.  Patient felt much better after work-up returned.  Troponin negative x2.  Laboratory testing otherwise reassuring.  Chest x-ray reassuring.  Patient felt better after GI cocktail suspect reflux as cause of symptoms.  Patient will follow-up with his gastroenterologist and will continue his outpatient medications.  He agreed with plan of care and had no other questions or concerns.  Patient discharged in good condition.   Final Clinical Impressions(s) / ED Diagnoses   Final diagnoses:  Other chest pain    ED Discharge Orders    None     Clinical Impression: 1. Other chest pain     Disposition: Discharge  Condition: Good  I have discussed  the results, Dx and Tx plan with the pt(& family if present). He/she/they expressed understanding and agree(s) with the plan. Discharge instructions discussed at great length. Strict return precautions discussed and pt &/or family have verbalized understanding of the instructions. No further questions at time of discharge.    Discharge Medication List as of 08/03/2018  9:45 PM      Follow Up: [facility] [place] [address] [place] [address] [place] [address] [phone] Schedule an appointment as soon as possible for a visit    [place] [address] [id number] mc [place]       Avanelle Pixley, Gwenyth Allegra, MD 08/04/18 [phone]

## 2018-08-03 NOTE — Discharge Instructions (Signed)
Your work-up today was overall reassuring.  We suspect is related to your reflux and GERD.  Please use your home reflux medication and follow-up with the gastroenterology team.  Please stay hydrated and rest.  If any symptoms change or worsen, please return to the nearest emergency department.

## 2018-10-30 ENCOUNTER — Emergency Department (HOSPITAL_COMMUNITY)
Admission: EM | Admit: 2018-10-30 | Discharge: 2018-10-30 | Disposition: A | Discharge status: Home / Self Care | Payer: BLUE CROSS/BLUE SHIELD | Attending: Emergency Medicine | Admitting: Emergency Medicine

## 2018-10-30 DIAGNOSIS — Z79899 Other long term (current) drug therapy: Secondary | ICD-10-CM | POA: Diagnosis not present

## 2018-10-30 DIAGNOSIS — J45909 Unspecified asthma, uncomplicated: Secondary | ICD-10-CM | POA: Insufficient documentation

## 2018-10-30 DIAGNOSIS — Z87891 Personal history of nicotine dependence: Secondary | ICD-10-CM | POA: Diagnosis not present

## 2018-10-30 DIAGNOSIS — R0982 Postnasal drip: Secondary | ICD-10-CM | POA: Diagnosis not present

## 2018-10-30 DIAGNOSIS — J029 Acute pharyngitis, unspecified: Secondary | ICD-10-CM | POA: Diagnosis present

## 2018-10-30 MED ORDER — LORATADINE 10 MG PO TABS: 10.0000 mg | ORAL_TABLET | Freq: Every day | ORAL | 0 refills | Status: DC

## 2018-10-30 MED ORDER — FLUTICASONE PROPIONATE 50 MCG/ACT NA SUSP: 1.0000 | Freq: Every day | NASAL | 2 refills | Status: DC

## 2018-10-30 NOTE — Discharge Instructions (Addendum)
Please read attached information. If you experience any new or worsening signs or symptoms please return to the emergency room for evaluation. Please follow-up with your primary care provider or specialist as discussed. Please use medication prescribed only as directed and discontinue taking if you have any concerning signs or symptoms.   °

## 2018-10-30 NOTE — ED Triage Notes (Signed)
Pt presents to the ED with throat irritation that he reports has been going on for 2-3 weeks now. Also complains of some nasal drainage that he feels is causing more irritation to his throat. Pt denies any fevers, chills, shortness of breath or chest pain. Reports occasional cough to clear his throat.

## 2018-10-30 NOTE — ED Provider Notes (Signed)
[facility] EMERGENCY DEPARTMENT Provider Note   CSN: [id number] Arrival date & time: 10/30/18  7829    History   Chief Complaint Chief Complaint  Patient presents with  . Sore Throat    HPI Antonio Sanchez is a 43 y.o. male.     HPI   43 year old male presents today with complaints of sore throat.  Patient notes several week history of irritation in his throat.  He notes nasal congestion and postnasal drainage.  He denies any fever cough or shortness of breath. (Nursing notes cough, he denies this with me).  He notes a history of allergies he is not currently taking any allergy medicine.  He does note a history of acid reflux but reports is uncontrolled with daily medication.  He denies any close sick contacts.  Past Medical History:  Diagnosis Date  . Acid reflux   . Allergic rhinitis   . Asthma   . Corneal abrasion, left   . Depression   . GERD (gastroesophageal reflux disease)   . Headache(784.0)   . Obesity   . Vitamin D deficiency     Patient Active Problem List   Diagnosis Date Noted  . VITAMIN D DEFICIENCY 06/23/2009  . DEPRESSION 06/23/2009  . ALLERGIC RHINITIS 06/23/2009  . ASTHMA 06/23/2009  . OBESITY, UNSPECIFIED 01/24/2009  . GERD, SEVERE 01/24/2009    Past Surgical History:  Procedure Laterality Date  . dilated esophagus    . WISDOM TOOTH EXTRACTION          Home Medications    Prior to Admission medications   Medication Sig Start Date End Date Taking? Authorizing Provider  ALPRAZolam Duanne Moron) 0.5 MG tablet Take 1 tablet (0.5 mg total) by mouth at bedtime as needed for anxiety. Patient not taking: Reported on 08/03/2018 03/24/16   Fransico Meadow, PA-C  fluticasone [facility]) 50 MCG/ACT nasal spray Place 1 spray into both nostrils daily. 10/30/18   Madaleine Simmon, Dellis Filbert, PA-C  lisinopril (ZESTRIL) 10 MG tablet Take 10 mg by mouth daily.    [provider]  loratadine (CLARITIN) 10 MG tablet Take 1 tablet (10 mg total) by  mouth daily. 10/30/18   Rhealyn Cullen, Dellis Filbert, PA-C  omeprazole (PRILOSEC) 40 MG capsule Take 1 capsule (40 mg total) by mouth daily before breakfast. 02/19/12   Gatha Mayer, MD  traMADol (ULTRAM) 50 MG tablet Take 1 tablet (50 mg total) by mouth every 6 (six) hours as needed for pain. Patient not taking: Reported on 03/24/2016 01/05/12   Milton Ferguson, MD    Family History Family History  Problem Relation Age of Onset  . Diabetes Mother   . Diabetes Other        Both sides    Social History Social History   Tobacco Use  . Smoking status: Former Research scientist (life sciences)  . Smokeless tobacco: Never Used  Substance Use Topics  . Alcohol use: No    Comment: social  . Drug use: Yes    Types: Marijuana    Comment: occ     Allergies   Patient has no known allergies.   Review of Systems Review of Systems  All other systems reviewed and are negative.    Physical Exam Updated Vital Signs BP 125/79 (BP Location: Right Arm)   Pulse 77   Temp 98.8 F (37.1 C) (Oral)   Resp 16   Ht 6' (1.829 m)   Wt 99.8 kg   SpO2 97%   BMI 29.84 kg/m   Physical Exam Vitals signs  and nursing note reviewed.  Constitutional:      Appearance: He is well-developed.  HENT:     Head: Normocephalic and atraumatic.     Comments: Oropharynx clear with no erythema exudate swelling or edema, voice is normal, no cervical lymphadenopathy    Mouth/Throat:     Mouth: Mucous membranes are moist.  Eyes:     General: No scleral icterus.       Right eye: No discharge.        Left eye: No discharge.     Conjunctiva/sclera: Conjunctivae normal.     Pupils: Pupils are equal, round, and reactive to light.  Neck:     Musculoskeletal: Normal range of motion.     Vascular: No JVD.     Trachea: No tracheal deviation.  Pulmonary:     Effort: Pulmonary effort is normal.     Breath sounds: No stridor.  Neurological:     Mental Status: He is alert and oriented to person, place, and time.     Coordination: Coordination  normal.  Psychiatric:        Behavior: Behavior normal.        Thought Content: Thought content normal.        Judgment: Judgment normal.      ED Treatments / Results  Labs (all labs ordered are listed, but only abnormal results are displayed) Labs Reviewed - No data to display  EKG None  Radiology No results found.  Procedures Procedures (including critical care time)  Medications Ordered in ED Medications - No data to display   Initial Impression / Assessment and Plan / ED Course  I have reviewed the triage vital signs and the nursing notes.  Pertinent labs & imaging results that were available during my care of the patient were reviewed by me and considered in my medical decision making (see chart for details).        43 year old male presents today with sore throat likely secondary to postnasal drainage.  He has no signs of infectious etiology on my exam.  He will be discharged with Flonase, antihistamines and outpatient follow-up with his primary care.  He verbalized understanding and agreement to today's plan.  Final Clinical Impressions(s) / ED Diagnoses   Final diagnoses:  Post-nasal drainage    ED Discharge Orders         Ordered    fluticasone (FLONASE) 50 MCG/ACT nasal spray  Daily     10/30/18 1045    loratadine (CLARITIN) 10 MG tablet  Daily     10/30/18 1045           Okey Regal, PA-C 10/30/18 1047    Charlesetta Shanks, MD 10/31/18 [phone]

## 2019-03-19 ENCOUNTER — Ambulatory Visit: Payer: Medicaid Other | Attending: Internal Medicine

## 2019-03-19 DIAGNOSIS — Z20822 Contact with and (suspected) exposure to covid-19: Secondary | ICD-10-CM

## 2019-03-20 LAB — NOVEL CORONAVIRUS, NAA: SARS-CoV-2, NAA: NOT DETECTED

## 2019-09-13 ENCOUNTER — Encounter: Payer: Self-pay | Admitting: Family Medicine

## 2019-09-13 ENCOUNTER — Ambulatory Visit (INDEPENDENT_AMBULATORY_CARE_PROVIDER_SITE_OTHER): Payer: 59 | Admitting: Family Medicine

## 2019-09-13 ENCOUNTER — Other Ambulatory Visit: Payer: Self-pay

## 2019-09-13 VITALS — BP 130/80 | HR 75 | Ht 72.5 in | Wt 244.4 lb

## 2019-09-13 DIAGNOSIS — K219 Gastro-esophageal reflux disease without esophagitis: Secondary | ICD-10-CM

## 2019-09-13 DIAGNOSIS — E559 Vitamin D deficiency, unspecified: Secondary | ICD-10-CM

## 2019-09-13 DIAGNOSIS — I10 Essential (primary) hypertension: Secondary | ICD-10-CM | POA: Diagnosis not present

## 2019-09-13 MED ORDER — LISINOPRIL 10 MG PO TABS: 10.0000 mg | ORAL_TABLET | Freq: Every day | ORAL | 2 refills | Status: DC

## 2019-09-13 MED ORDER — OMEPRAZOLE 20 MG PO CPDR: 20.0000 mg | DELAYED_RELEASE_CAPSULE | Freq: Every day | ORAL | 2 refills | Status: DC

## 2019-09-13 NOTE — Patient Instructions (Signed)
DASH Eating Plan DASH stands for "Dietary Approaches to Stop Hypertension." The DASH eating plan is a healthy eating plan that has been shown to reduce high blood pressure (hypertension). It may also reduce your risk for type 2 diabetes, heart disease, and stroke. The DASH eating plan may also help with weight loss. What are tips for following this plan?  General guidelines  Avoid eating more than 2,300 mg (milligrams) of salt (sodium) a day. If you have hypertension, you may need to reduce your sodium intake to 1,500 mg a day.  Limit alcohol intake to no more than 1 drink a day for nonpregnant women and 2 drinks a day for men. One drink equals 12 oz of beer, 5 oz of wine, or 1 oz of hard liquor.  Work with your health care provider to maintain a healthy body weight or to lose weight. Ask what an ideal weight is for you.  Get at least 30 minutes of exercise that causes your heart to beat faster (aerobic exercise) most days of the week. Activities may include walking, swimming, or biking.  Work with your health care provider or diet and nutrition specialist (dietitian) to adjust your eating plan to your individual calorie needs. Reading food labels   Check food labels for the amount of sodium per serving. Choose foods with less than 5 percent of the Daily Value of sodium. Generally, foods with less than 300 mg of sodium per serving fit into this eating plan.  To find whole grains, look for the word "whole" as the first word in the ingredient list. Shopping  Buy products labeled as "low-sodium" or "no salt added."  Buy fresh foods. Avoid canned foods and premade or frozen meals. Cooking  Avoid adding salt when cooking. Use salt-free seasonings or herbs instead of table salt or sea salt. Check with your health care provider or pharmacist before using salt substitutes.  Do not fry foods. Cook foods using healthy methods such as baking, boiling, grilling, and broiling instead.  Cook with  heart-healthy oils, such as olive, canola, soybean, or sunflower oil. Meal planning  Eat a balanced diet that includes: ? 5 or more servings of fruits and vegetables each day. At each meal, try to fill half of your plate with fruits and vegetables. ? Up to 6-8 servings of whole grains each day. ? Less than 6 oz of lean meat, poultry, or fish each day. A 3-oz serving of meat is about the same size as a deck of cards. One egg equals 1 oz. ? 2 servings of low-fat dairy each day. ? A serving of nuts, seeds, or beans 5 times each week. ? Heart-healthy fats. Healthy fats called Omega-3 fatty acids are found in foods such as flaxseeds and coldwater fish, like sardines, salmon, and mackerel.  Limit how much you eat of the following: ? Canned or prepackaged foods. ? Food that is high in trans fat, such as fried foods. ? Food that is high in saturated fat, such as fatty meat. ? Sweets, desserts, sugary drinks, and other foods with added sugar. ? Full-fat dairy products.  Do not salt foods before eating.  Try to eat at least 2 vegetarian meals each week.  Eat more home-cooked food and less restaurant, buffet, and fast food.  When eating at a restaurant, ask that your food be prepared with less salt or no salt, if possible. What foods are recommended? The items listed may not be a complete list. Talk with your dietitian about   what dietary choices are best for you. Grains Whole-grain or whole-wheat bread. Whole-grain or whole-wheat pasta. Brown rice. Oatmeal. Quinoa. Bulgur. Whole-grain and low-sodium cereals. Pita bread. Low-fat, low-sodium crackers. Whole-wheat flour tortillas. Vegetables Fresh or frozen vegetables (raw, steamed, roasted, or grilled). Low-sodium or reduced-sodium tomato and vegetable juice. Low-sodium or reduced-sodium tomato sauce and tomato paste. Low-sodium or reduced-sodium canned vegetables. Fruits All fresh, dried, or frozen fruit. Canned fruit in natural juice (without  added sugar). Meat and other protein foods Skinless chicken or turkey. Ground chicken or turkey. Pork with fat trimmed off. Fish and seafood. Egg whites. Dried beans, peas, or lentils. Unsalted nuts, nut butters, and seeds. Unsalted canned beans. Lean cuts of beef with fat trimmed off. Low-sodium, lean deli meat. Dairy Low-fat (1%) or fat-free (skim) milk. Fat-free, low-fat, or reduced-fat cheeses. Nonfat, low-sodium ricotta or cottage cheese. Low-fat or nonfat yogurt. Low-fat, low-sodium cheese. Fats and oils Soft margarine without trans fats. Vegetable oil. Low-fat, reduced-fat, or light mayonnaise and salad dressings (reduced-sodium). Canola, safflower, olive, soybean, and sunflower oils. Avocado. Seasoning and other foods Herbs. Spices. Seasoning mixes without salt. Unsalted popcorn and pretzels. Fat-free sweets. What foods are not recommended? The items listed may not be a complete list. Talk with your dietitian about what dietary choices are best for you. Grains Baked goods made with fat, such as croissants, muffins, or some breads. Dry pasta or rice meal packs. Vegetables Creamed or fried vegetables. Vegetables in a cheese sauce. Regular canned vegetables (not low-sodium or reduced-sodium). Regular canned tomato sauce and paste (not low-sodium or reduced-sodium). Regular tomato and vegetable juice (not low-sodium or reduced-sodium). Pickles. Olives. Fruits Canned fruit in a light or heavy syrup. Fried fruit. Fruit in cream or butter sauce. Meat and other protein foods Fatty cuts of meat. Ribs. Fried meat. Bacon. Sausage. Bologna and other processed lunch meats. Salami. Fatback. Hotdogs. Bratwurst. Salted nuts and seeds. Canned beans with added salt. Canned or smoked fish. Whole eggs or egg yolks. Chicken or turkey with skin. Dairy Whole or 2% milk, cream, and half-and-half. Whole or full-fat cream cheese. Whole-fat or sweetened yogurt. Full-fat cheese. Nondairy creamers. Whipped toppings.  Processed cheese and cheese spreads. Fats and oils Butter. Stick margarine. Lard. Shortening. Ghee. Bacon fat. Tropical oils, such as coconut, palm kernel, or palm oil. Seasoning and other foods Salted popcorn and pretzels. Onion salt, garlic salt, seasoned salt, table salt, and sea salt. Worcestershire sauce. Tartar sauce. Barbecue sauce. Teriyaki sauce. Soy sauce, including reduced-sodium. Steak sauce. Canned and packaged gravies. Fish sauce. Oyster sauce. Cocktail sauce. Horseradish that you find on the shelf. Ketchup. Mustard. Meat flavorings and tenderizers. Bouillon cubes. Hot sauce and Tabasco sauce. Premade or packaged marinades. Premade or packaged taco seasonings. Relishes. Regular salad dressings. Where to find more information:  National Heart, Lung, and Blood Institute: www.nhlbi.nih.gov  American Heart Association: www.heart.org Summary  The DASH eating plan is a healthy eating plan that has been shown to reduce high blood pressure (hypertension). It may also reduce your risk for type 2 diabetes, heart disease, and stroke.  With the DASH eating plan, you should limit salt (sodium) intake to 2,300 mg a day. If you have hypertension, you may need to reduce your sodium intake to 1,500 mg a day.  When on the DASH eating plan, aim to eat more fresh fruits and vegetables, whole grains, lean proteins, low-fat dairy, and heart-healthy fats.  Work with your health care provider or diet and nutrition specialist (dietitian) to adjust your eating plan to your   individual calorie needs. This information is not intended to replace advice given to you by your health care provider. Make sure you discuss any questions you have with your health care provider. Document Revised: 02/11/2017 Document Reviewed: 02/23/2016 Elsevier Patient Education  2020 Elsevier Inc.  

## 2019-09-13 NOTE — Progress Notes (Signed)
   Subjective:    Patient ID: Antonio Sanchez, male    DOB: September 06, 1975, 44 y.o.   MRN: [id number]  HPI Chief Complaint  Patient presents with  . new pt    new pt get established needs refills on meds   He is new to the practice and here to establish care.  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 States he knows his triggers.  He has to take omeprazole daily  Hx of EGD and dilation of esophagus in 2013. He saw Dr. Carlean Purl   Denies taking NSAIDs Alcohol occasionally   HTN- takes lisinopril 10 mg daily without any concerns.  Does not check BP at home.   Vitamin D def- he was taking prescription vitamin D. Has been out of it and is not currently taking a supplement.   Allergies and asthma- seasonal  He has an albuterol inhaler at home.   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   Review of Systems Pertinent positives and negatives in the history of present illness.     Objective:   Physical Exam BP 130/80   Pulse 75   Ht 6' 0.5" (1.842 m)   Wt 244 lb 6.4 oz (110.9 kg)   SpO2 98%   BMI 32.69 kg/m   Alert and in no distress.  Cardiac exam shows a regular sinus rhythm without murmurs or gallops. Lungs are clear to auscultation.      Assessment & Plan:  Essential hypertension - Plan: CBC with Differential/Platelet, Comprehensive metabolic panel -BP controlled. Continue current medication. DASH diet discussed.  Follow up pending labs.   Gastroesophageal reflux disease, unspecified whether esophagitis present -continue PPI. Counseling on lifestyle modifications to control GERD. Consider referral back to GI if needed.   Vitamin D deficiency - Plan: VITAMIN D 25 Hydroxy (Vit-D Deficiency, Fractures) -check vitamin D level and follow up

## 2019-09-14 ENCOUNTER — Other Ambulatory Visit: Payer: Self-pay | Admitting: Family Medicine

## 2019-09-14 DIAGNOSIS — E559 Vitamin D deficiency, unspecified: Secondary | ICD-10-CM

## 2019-09-14 LAB — CBC WITH DIFFERENTIAL/PLATELET
Basophils Absolute: 0 10*3/uL (ref 0.0–0.2)
Basos: 1 %
EOS (ABSOLUTE): 0.1 10*3/uL (ref 0.0–0.4)
Eos: 2 %
Hematocrit: 42.9 % (ref 37.5–51.0)
Hemoglobin: 14.9 g/dL (ref 13.0–17.7)
Immature Grans (Abs): 0 10*3/uL (ref 0.0–0.1)
Immature Granulocytes: 0 %
Lymphocytes Absolute: 2.2 10*3/uL (ref 0.7–3.1)
Lymphs: 50 %
MCH: 29.7 pg (ref 26.6–33.0)
MCHC: 34.7 g/dL (ref 31.5–35.7)
MCV: 86 fL (ref 79–97)
Monocytes Absolute: 0.4 10*3/uL (ref 0.1–0.9)
Monocytes: 8 %
Neutrophils Absolute: 1.7 10*3/uL (ref 1.4–7.0)
Neutrophils: 39 %
Platelets: 218 10*3/uL (ref 150–450)
RBC: 5.01 x10E6/uL (ref 4.14–5.80)
RDW: 12.4 % (ref 11.6–15.4)
WBC: 4.3 10*3/uL (ref 3.4–10.8)

## 2019-09-14 LAB — COMPREHENSIVE METABOLIC PANEL
ALT: 25 IU/L (ref 0–44)
AST: 16 IU/L (ref 0–40)
Albumin/Globulin Ratio: 1.7 (ref 1.2–2.2)
Albumin: 4.6 g/dL (ref 4.0–5.0)
Alkaline Phosphatase: 57 IU/L (ref 48–121)
BUN/Creatinine Ratio: 11 (ref 9–20)
BUN: 12 mg/dL (ref 6–24)
Bilirubin Total: 0.5 mg/dL (ref 0.0–1.2)
CO2: 25 mmol/L (ref 20–29)
Calcium: 9.4 mg/dL (ref 8.7–10.2)
Chloride: 104 mmol/L (ref 96–106)
Creatinine, Ser: 1.08 mg/dL (ref 0.76–1.27)
GFR calc Af Amer: 97 mL/min/{1.73_m2} (ref >59)
GFR calc non Af Amer: 84 mL/min/{1.73_m2} (ref >59)
Globulin, Total: 2.7 g/dL (ref 1.5–4.5)
Glucose: 93 mg/dL (ref 65–99)
Potassium: 4.3 mmol/L (ref 3.5–5.2)
Sodium: 140 mmol/L (ref 134–144)
Total Protein: 7.3 g/dL (ref 6.0–8.5)

## 2019-09-14 LAB — VITAMIN D 25 HYDROXY (VIT D DEFICIENCY, FRACTURES): Vit D, 25-Hydroxy: 13 ng/mL — ABNORMAL LOW (ref 30.0–100.0)

## 2019-09-14 MED ORDER — VITAMIN D (ERGOCALCIFEROL) 1.25 MG (50000 UNIT) PO CAPS: 50000.0000 [IU] | ORAL_CAPSULE | ORAL | 0 refills | Status: DC

## 2019-10-30 ENCOUNTER — Emergency Department (HOSPITAL_COMMUNITY)
Admission: EM | Admit: 2019-10-30 | Discharge: 2019-10-30 | Disposition: A | Discharge status: Home / Self Care | Payer: Medicaid Other | Attending: Emergency Medicine | Admitting: Emergency Medicine

## 2019-10-30 ENCOUNTER — Other Ambulatory Visit: Payer: Self-pay

## 2019-10-30 ENCOUNTER — Other Ambulatory Visit: Payer: Medicaid Other

## 2019-10-30 ENCOUNTER — Encounter: Payer: Self-pay | Admitting: Family Medicine

## 2019-10-30 ENCOUNTER — Encounter (HOSPITAL_COMMUNITY): Payer: Self-pay | Admitting: Emergency Medicine

## 2019-10-30 ENCOUNTER — Telehealth (INDEPENDENT_AMBULATORY_CARE_PROVIDER_SITE_OTHER): Payer: 59 | Admitting: Family Medicine

## 2019-10-30 VITALS — Wt 234.0 lb

## 2019-10-30 DIAGNOSIS — R112 Nausea with vomiting, unspecified: Secondary | ICD-10-CM | POA: Insufficient documentation

## 2019-10-30 DIAGNOSIS — Z5321 Procedure and treatment not carried out due to patient leaving prior to being seen by health care provider: Secondary | ICD-10-CM | POA: Diagnosis not present

## 2019-10-30 DIAGNOSIS — R509 Fever, unspecified: Secondary | ICD-10-CM | POA: Insufficient documentation

## 2019-10-30 DIAGNOSIS — R0989 Other specified symptoms and signs involving the circulatory and respiratory systems: Secondary | ICD-10-CM

## 2019-10-30 DIAGNOSIS — R52 Pain, unspecified: Secondary | ICD-10-CM | POA: Diagnosis not present

## 2019-10-30 DIAGNOSIS — R531 Weakness: Secondary | ICD-10-CM | POA: Insufficient documentation

## 2019-10-30 DIAGNOSIS — Z20822 Contact with and (suspected) exposure to covid-19: Secondary | ICD-10-CM

## 2019-10-30 LAB — CBC WITH DIFFERENTIAL/PLATELET
Abs Immature Granulocytes: 0.08 10*3/uL — ABNORMAL HIGH (ref 0.00–0.07)
Basophils Absolute: 0 10*3/uL (ref 0.0–0.1)
Basophils Relative: 0 %
Eosinophils Absolute: 0 10*3/uL (ref 0.0–0.5)
Eosinophils Relative: 0 %
HCT: 44.5 % (ref 39.0–52.0)
Hemoglobin: 15.3 g/dL (ref 13.0–17.0)
Immature Granulocytes: 1 %
Lymphocytes Relative: 8 %
Lymphs Abs: 1 10*3/uL (ref 0.7–4.0)
MCH: 29.3 pg (ref 26.0–34.0)
MCHC: 34.4 g/dL (ref 30.0–36.0)
MCV: 85.2 fL (ref 80.0–100.0)
Monocytes Absolute: 1 10*3/uL (ref 0.1–1.0)
Monocytes Relative: 8 %
Neutro Abs: 11 10*3/uL — ABNORMAL HIGH (ref 1.7–7.7)
Neutrophils Relative %: 83 %
Platelets: 214 10*3/uL (ref 150–400)
RBC: 5.22 MIL/uL (ref 4.22–5.81)
RDW: 11.9 % (ref 11.5–15.5)
WBC: 13.1 10*3/uL — ABNORMAL HIGH (ref 4.0–10.5)
nRBC: 0 % (ref 0.0–0.2)

## 2019-10-30 LAB — LACTIC ACID, PLASMA: Lactic Acid, Venous: 1.4 mmol/L (ref 0.5–1.9)

## 2019-10-30 LAB — URINALYSIS, ROUTINE W REFLEX MICROSCOPIC
Bacteria, UA: NONE SEEN
Bilirubin Urine: NEGATIVE
Glucose, UA: NEGATIVE mg/dL
Hgb urine dipstick: NEGATIVE
Ketones, ur: 5 mg/dL — AB
Nitrite: NEGATIVE
Protein, ur: NEGATIVE mg/dL
Specific Gravity, Urine: 1.021 (ref 1.005–1.030)
pH: 9 — ABNORMAL HIGH (ref 5.0–8.0)

## 2019-10-30 LAB — COMPREHENSIVE METABOLIC PANEL
ALT: 27 U/L (ref 0–44)
AST: 23 U/L (ref 15–41)
Albumin: 4.4 g/dL (ref 3.5–5.0)
Alkaline Phosphatase: 44 U/L (ref 38–126)
Anion gap: 12 (ref 5–15)
BUN: 11 mg/dL (ref 6–20)
CO2: 20 mmol/L — ABNORMAL LOW (ref 22–32)
Calcium: 9.4 mg/dL (ref 8.9–10.3)
Chloride: 101 mmol/L (ref 98–111)
Creatinine, Ser: 1 mg/dL (ref 0.61–1.24)
GFR calc Af Amer: >60 mL/min (ref >60)
GFR calc non Af Amer: >60 mL/min (ref >60)
Glucose, Bld: 115 mg/dL — ABNORMAL HIGH (ref 70–99)
Potassium: 3.7 mmol/L (ref 3.5–5.1)
Sodium: 133 mmol/L — ABNORMAL LOW (ref 135–145)
Total Bilirubin: 1.3 mg/dL — ABNORMAL HIGH (ref 0.3–1.2)
Total Protein: 7.6 g/dL (ref 6.5–8.1)

## 2019-10-30 NOTE — ED Notes (Signed)
Patient no longer seen in lobby

## 2019-10-30 NOTE — ED Triage Notes (Signed)
Patient reports gen weakness, nausea, emesis, fever/chills.

## 2019-10-30 NOTE — ED Notes (Signed)
Pt called no answer x1

## 2019-10-30 NOTE — Progress Notes (Signed)
   Subjective:  Documentation for virtual audio and video telecommunications through [place] encounter:  The patient was located in his car. 2 patient identifiers used.  The provider was located in the office. The patient did consent to this visit and is aware of possible charges through their insurance for this visit.  The other persons participating in this telemedicine service were none. Time spent on call was 12 minutes and in review of previous records 15 minutes total.  This virtual service is not related to other E/M service within previous 7 days.   Patient ID: Antonio Sanchez, male    DOB: 1975-07-31, 44 y.o.   MRN: [id number]  HPI Chief Complaint  Patient presents with  . congestion    congestion,cough to get phelgm, scratchy throat, since 7pm last night.    Complains of a 15 hour history of scratchy throat, chest congestion, and chest tightness.  Reports occasional cough.  States he has body aches and fatigue.   Denies fever, chills, headache, rhinorrhea, nasal congestion, abdominal pain, nausea, vomiting, diarrhea.  Denies any sick exposures. Works at [employer] in [place].   Moderna vaccines received in May.   Started taking Mucinex this morning.  He has underlying hypertension but does not have a blood pressure machine at home.  States he plans to purchase one.   Review of Systems Pertinent positives and negatives in the history of present illness.     Objective:   Physical Exam Wt 234 lb (106.1 kg)   BMI 31.30 kg/m   Alert and oriented in no acute distress.  Respirations unlabored.  Speaking in complete senses without difficulty.  Normal speech, mood and thought process.      Assessment & Plan:  Chest congestion  Body aches  Recommend he get Covid testing.  He was vaccinated but we discussed breakthrough cases.  Discussed symptomatic treatment including increasing hydration and taking Mucinex.  Recommend he keep a close eye on his vital signs  including blood pressure, pulse and temperature.  Also encouraged him to get a pulse oximeter if possible.  He will follow-up when he has his Covid result especially if it is positive.  He does have underlying hypertension and obesity which would most likely make him a candidate for infusion the monoclonal antibodies.

## 2019-10-31 ENCOUNTER — Emergency Department (HOSPITAL_COMMUNITY)
Admission: EM | Admit: 2019-10-31 | Discharge: 2019-10-31 | Disposition: A | Discharge status: Home / Self Care | Payer: Medicaid Other | Attending: Emergency Medicine | Admitting: Emergency Medicine

## 2019-10-31 ENCOUNTER — Encounter (HOSPITAL_COMMUNITY): Payer: Self-pay | Admitting: *Deleted

## 2019-10-31 DIAGNOSIS — R110 Nausea: Secondary | ICD-10-CM | POA: Diagnosis not present

## 2019-10-31 DIAGNOSIS — R197 Diarrhea, unspecified: Secondary | ICD-10-CM | POA: Insufficient documentation

## 2019-10-31 DIAGNOSIS — M791 Myalgia, unspecified site: Secondary | ICD-10-CM | POA: Diagnosis not present

## 2019-10-31 DIAGNOSIS — R531 Weakness: Secondary | ICD-10-CM | POA: Insufficient documentation

## 2019-10-31 DIAGNOSIS — B349 Viral infection, unspecified: Secondary | ICD-10-CM | POA: Diagnosis not present

## 2019-10-31 DIAGNOSIS — Z20822 Contact with and (suspected) exposure to covid-19: Secondary | ICD-10-CM | POA: Diagnosis not present

## 2019-10-31 DIAGNOSIS — J029 Acute pharyngitis, unspecified: Secondary | ICD-10-CM | POA: Diagnosis present

## 2019-10-31 DIAGNOSIS — Z87891 Personal history of nicotine dependence: Secondary | ICD-10-CM | POA: Diagnosis not present

## 2019-10-31 DIAGNOSIS — J45909 Unspecified asthma, uncomplicated: Secondary | ICD-10-CM | POA: Diagnosis not present

## 2019-10-31 DIAGNOSIS — K219 Gastro-esophageal reflux disease without esophagitis: Secondary | ICD-10-CM | POA: Diagnosis not present

## 2019-10-31 LAB — SARS CORONAVIRUS 2 BY RT PCR (HOSPITAL ORDER, PERFORMED IN ~~LOC~~ HOSPITAL LAB): SARS Coronavirus 2: NEGATIVE

## 2019-10-31 LAB — NOVEL CORONAVIRUS, NAA: SARS-CoV-2, NAA: NOT DETECTED

## 2019-10-31 LAB — SARS-COV-2, NAA 2 DAY TAT

## 2019-10-31 NOTE — ED Triage Notes (Signed)
Pt back into triage for eval of sore throat, fever, and chills. Pt states he fell asleep outside and was taken out of system.

## 2019-10-31 NOTE — ED Provider Notes (Signed)
[facility] Emergency Department Provider Note MRN:  [id number]  Arrival date & time: 10/31/19     Chief Complaint   Sore Throat and Weakness   History of Present Illness   Antonio Sanchez is a 44 y.o. year-old male with a history of GERD presenting to the ED with chief complaint of sore throat.  2 days of cough, chest congestion, mild sore throat, body aches, nausea, mild diarrhea.  He is vaccinated for Covid but he thinks he might have it anyway.  Denies any shortness of breath, had some soreness in the chest with cough yesterday.  Mild headache, no abdominal pain.  Symptoms are constant, mild to moderate, no exacerbating relieving factors.  Review of Systems  A complete 10 system review of systems was obtained and all systems are negative except as noted in the HPI and PMH.   Patient's Health History    Past Medical History:  Diagnosis Date  . Acid reflux   . Allergic rhinitis   . Asthma   . Corneal abrasion, left   . Depression   . GERD (gastroesophageal reflux disease)   . Headache(784.0)   . Obesity   . Vitamin D deficiency     Past Surgical History:  Procedure Laterality Date  . dilated esophagus    . WISDOM TOOTH EXTRACTION      Family History  Problem Relation Age of Onset  . Diabetes Mother   . Diabetes Other        Both sides    Social History   Socioeconomic History  . Marital status: Single    Spouse name: Not on file  . Number of children: 2  . Years of education: Not on file  . Highest education level: Not on file  Occupational History  . Not on file  Tobacco Use  . Smoking status: Former Research scientist (life sciences)  . Smokeless tobacco: Never Used  Substance and Sexual Activity  . Alcohol use: No    Comment: social  . Drug use: Yes    Types: Marijuana    Comment: occ  . Sexual activity: Yes    Partners: Female  Other Topics Concern  . Not on file  Social History Narrative   Lives w/girlfriend - uneployed   2 sons born 2001, 2003    Social Determinants of Health   Financial Resource Strain:   . Difficulty of Paying Living Expenses:   Food Insecurity:   . Worried About Charity fundraiser in the Last Year:   . Arboriculturist in the Last Year:   Transportation Needs:   . Film/video editor (Medical):   Marland Kitchen Lack of Transportation (Non-Medical):   Physical Activity:   . Days of Exercise per Week:   . Minutes of Exercise per Session:   Stress:   . Feeling of Stress :   Social Connections:   . Frequency of Communication with Friends and Family:   . Frequency of Social Gatherings with Friends and Family:   . Attends Religious Services:   . Active Member of Clubs or Organizations:   . Attends Archivist Meetings:   Marland Kitchen Marital Status:   Intimate Partner Violence:   . Fear of Current or Ex-Partner:   . Emotionally Abused:   Marland Kitchen Physically Abused:   . Sexually Abused:      Physical Exam   Vitals:   10/31/19 0921 10/31/19 0954  BP: 124/81 123/88  Pulse: 89 76  Resp: 16 16  Temp: 97.7  F (36.5 C) 98.3 F (36.8 C)  SpO2: 95% 100%    CONSTITUTIONAL: Well-appearing, NAD NEURO:  Alert and oriented x 3, no focal deficits EYES:  eyes equal and reactive ENT/NECK:  no LAD, no JVD CARDIO: Regular rate, well-perfused, normal S1 and S2 PULM:  CTAB no wheezing or rhonchi GI/GU:  normal bowel sounds, non-distended, non-tender MSK/SPINE:  No gross deformities, no edema SKIN:  no rash, atraumatic PSYCH:  Appropriate speech and behavior  *Additional and/or pertinent findings included in MDM below  Diagnostic and Interventional Summary    EKG Interpretation  Date/Time:    Ventricular Rate:    PR Interval:    QRS Duration:   QT Interval:    QTC Calculation:   R Axis:     Text Interpretation:        Labs Reviewed  SARS CORONAVIRUS 2 BY RT PCR (HOSPITAL ORDER, [facility])    No orders to display    Medications - No data to display   Procedures  /  Critical  Care Procedures  ED Course and Medical Decision Making  I have reviewed the triage vital signs, the nursing notes, and pertinent available records from the EMR.  Listed above are laboratory and imaging tests that I personally ordered, reviewed, and interpreted and then considered in my medical decision making (see below for details).  Consistent with viral illness, possibly coronavirus.  Oxygen saturations are 95%, no labored breathing, clear lungs, benign abdomen, no indication for further testing or admission here in the emergency department, will swab for Covid and advise home isolation.      Antonio Sanchez was evaluated in Emergency Department on 10/31/2019 for the symptoms described in the history of present illness. He was evaluated in the context of the global COVID-19 pandemic, which necessitated consideration that the patient might be at risk for infection with the SARS-CoV-2 virus that causes COVID-19. Institutional protocols and algorithms that pertain to the evaluation of patients at risk for COVID-19 are in a state of rapid change based on information released by regulatory bodies including the CDC and federal and state organizations. These policies and algorithms were followed during the patient's care in the ED.  Barth Kirks. Sedonia Small, [place] [email or web address]  Final Clinical Impressions(s) / ED Diagnoses     ICD-10-CM   1. Viral illness  B34.9     ED Discharge Orders    None       Discharge Instructions Discussed with and Provided to Patient:     Discharge Instructions     You were evaluated in the Emergency Department and after careful evaluation, we did not find any emergent condition requiring admission or further testing in the hospital.  Your exam/testing today is overall reassuring.  Your symptoms seem to be due to a virus, possibly the coronavirus.  We have tested you for the coronavirus here in the Emergency  Department.  Please isolate or quarantine at home until you receive a negative test result.  If positive, we recommend continued home quarantine per [facility] recommendations.   Please return to the Emergency Department if you experience any worsening of your condition.   Thank you for allowing [place] to be a part of your care.      Maudie Flakes, MD 10/31/19 [phone]

## 2019-10-31 NOTE — Discharge Instructions (Addendum)
You were evaluated in the Emergency Department and after careful evaluation, we did not find any emergent condition requiring admission or further testing in the hospital.  Your exam/testing today is overall reassuring.  Your symptoms seem to be due to a virus, possibly the coronavirus.  We have tested you for the coronavirus here in the Emergency Department.  Please isolate or quarantine at home until you receive a negative test result.  If positive, we recommend continued home quarantine per [facility] recommendations.   Please return to the Emergency Department if you experience any worsening of your condition.   Thank you for allowing [place] to be a part of your care.

## 2019-12-07 ENCOUNTER — Telehealth: Payer: Self-pay | Admitting: Family Medicine

## 2019-12-07 MED ORDER — OMEPRAZOLE 20 MG PO CPDR: 20.0000 mg | DELAYED_RELEASE_CAPSULE | Freq: Every day | ORAL | 0 refills | Status: DC

## 2019-12-07 MED ORDER — LISINOPRIL 10 MG PO TABS: 10.0000 mg | ORAL_TABLET | Freq: Every day | ORAL | 0 refills | Status: DC

## 2019-12-07 NOTE — Telephone Encounter (Signed)
Pt called in after phones were off and per J[facility] of to refill

## 2020-01-09 ENCOUNTER — Other Ambulatory Visit: Payer: Self-pay | Admitting: Family Medicine

## 2020-01-09 DIAGNOSIS — E559 Vitamin D deficiency, unspecified: Secondary | ICD-10-CM

## 2020-05-02 ENCOUNTER — Other Ambulatory Visit: Payer: Self-pay | Admitting: Family Medicine

## 2020-06-01 ENCOUNTER — Other Ambulatory Visit: Payer: Self-pay | Admitting: Family Medicine

## 2020-06-02 NOTE — Telephone Encounter (Signed)
Left message for pt to call back to schedule an appt

## 2020-06-06 ENCOUNTER — Other Ambulatory Visit: Payer: Self-pay | Admitting: Family Medicine

## 2020-06-06 NOTE — Telephone Encounter (Signed)
Pt called and needs this medication refilled. He wants it sent to the [facility] on [address].

## 2020-06-06 NOTE — Telephone Encounter (Signed)
It looks like it was refilled on 05/06/2020. Please check, this may be a duplicate. Also, he needs to have a visit in the next 3 months.

## 2020-06-25 ENCOUNTER — Ambulatory Visit: Payer: 59 | Admitting: Family Medicine

## 2020-06-25 ENCOUNTER — Encounter: Payer: Self-pay | Admitting: Family Medicine

## 2020-06-25 ENCOUNTER — Other Ambulatory Visit: Payer: Self-pay

## 2020-06-25 VITALS — BP 120/80 | HR 79 | Wt 253.8 lb

## 2020-06-25 DIAGNOSIS — K219 Gastro-esophageal reflux disease without esophagitis: Secondary | ICD-10-CM

## 2020-06-25 DIAGNOSIS — E559 Vitamin D deficiency, unspecified: Secondary | ICD-10-CM

## 2020-06-25 DIAGNOSIS — I10 Essential (primary) hypertension: Secondary | ICD-10-CM

## 2020-06-25 DIAGNOSIS — J4520 Mild intermittent asthma, uncomplicated: Secondary | ICD-10-CM | POA: Diagnosis not present

## 2020-06-25 DIAGNOSIS — R200 Anesthesia of skin: Secondary | ICD-10-CM

## 2020-06-25 DIAGNOSIS — R202 Paresthesia of skin: Secondary | ICD-10-CM

## 2020-06-25 HISTORY — DX: Gastro-esophageal reflux disease without esophagitis: K21.9

## 2020-06-25 MED ORDER — ALBUTEROL SULFATE HFA 108 (90 BASE) MCG/ACT IN AERS: 2.0000 | INHALATION_SPRAY | Freq: Four times a day (QID) | RESPIRATORY_TRACT | 1 refills | Status: DC | PRN

## 2020-06-25 NOTE — Progress Notes (Signed)
   Subjective:    Patient ID: Antonio Sanchez, male    DOB: 06/12/1975, 45 y.o.   MRN: [id number]  HPI Chief Complaint  Patient presents with  . med check    Med check- arms are going to sleep 1-2 weeks   He is here for a medication management visit.   HTN- does not check BP at home.  Takes lisinopril 10 mg daily. No concerns   Asthma- seasonal allergies. States his albuterol inhaler ran out in the past year.  States pollen has been flaring up his asthma. He is not on allergy medication. Reports recent cough, wheezing and chest tightness.   GERD- takes omeprazole daily. States he does not try to cut back. He belches.   Vitamin D def- is not currently taking a supplement.   States his arms are going numb and tingling for the past 1-2 weeks. States his whole arm starts tingling.  He is leaning forward on his elbows when this occurs. Right arm worse than left. No other numbness, tingling or weakness. No facial weakness, slurred speech or any stroke symptoms.   Denies fever, chills, dizziness, chest pain, palpitations, abdominal pain, N/V/D, urinary symptoms, LE edema.   Reviewed allergies, medications, past medical, surgical, family, and social history.     Review of Systems Pertinent positives and negatives in the history of present illness.     Objective:   Physical Exam BP 120/80   Pulse 79   Wt 253 lb 12.8 oz (115.1 kg)   SpO2 97%   BMI 31.72 kg/m   Alert and in no distress. Neck is supple, normal ROM and without adenopathy or thyromegaly. Cardiac exam shows a regular sinus rhythm without murmurs or gallops. Lungs are clear to auscultation. Bilateral upper extremities with normal sensation, pulse, cap refill, ROM and strength. DTRs normal and symmetric.       Assessment & Plan:  Essential hypertension - Plan: CBC with Differential/Platelet, Comprehensive metabolic panel -Blood pressure controlled.  Continue lisinopril.  Recommend low-sodium diet and getting plenty of  physical activity.  Gastroesophageal reflux disease, unspecified whether esophagitis present -He has been taking omeprazole daily and I do encourage him to cut back to as needed.  Recommend controlling GERD with lifestyle modifications and avoiding triggers.  Mild intermittent asthma without complication -I recommend trying a daily over-the-counter antihistamine such as Claritin, Allegra or Zyrtec since allergies are a trigger for his asthma.  Albuterol also prescribed.  Vitamin D deficiency - Plan: VITAMIN D 25 Hydroxy (Vit-D Deficiency, Fractures) -Check vitamin D level and prescribe as appropriate  Numbness and tingling of right arm -Discussed that the most likely explanation is ulnar nerve compression.  Discussed avoiding applying pressure by leaning on his elbows and see if this helps.  Discussed strokelike symptoms and that these are not present.  He will follow-up if this is not improving

## 2020-06-26 ENCOUNTER — Other Ambulatory Visit: Payer: Self-pay | Admitting: Family Medicine

## 2020-06-26 DIAGNOSIS — E559 Vitamin D deficiency, unspecified: Secondary | ICD-10-CM

## 2020-06-26 LAB — CBC WITH DIFFERENTIAL/PLATELET
Basophils Absolute: 0 10*3/uL (ref 0.0–0.2)
Basos: 1 %
EOS (ABSOLUTE): 0.1 10*3/uL (ref 0.0–0.4)
Eos: 3 %
Hematocrit: 43.1 % (ref 37.5–51.0)
Hemoglobin: 14.8 g/dL (ref 13.0–17.7)
Immature Grans (Abs): 0 10*3/uL (ref 0.0–0.1)
Immature Granulocytes: 0 %
Lymphocytes Absolute: 2.1 10*3/uL (ref 0.7–3.1)
Lymphs: 52 %
MCH: 29.8 pg (ref 26.6–33.0)
MCHC: 34.3 g/dL (ref 31.5–35.7)
MCV: 87 fL (ref 79–97)
Monocytes Absolute: 0.4 10*3/uL (ref 0.1–0.9)
Monocytes: 10 %
Neutrophils Absolute: 1.4 10*3/uL (ref 1.4–7.0)
Neutrophils: 34 %
Platelets: 211 10*3/uL (ref 150–450)
RBC: 4.97 x10E6/uL (ref 4.14–5.80)
RDW: 12.6 % (ref 11.6–15.4)
WBC: 4.1 10*3/uL (ref 3.4–10.8)

## 2020-06-26 LAB — COMPREHENSIVE METABOLIC PANEL
ALT: 32 IU/L (ref 0–44)
AST: 20 IU/L (ref 0–40)
Albumin/Globulin Ratio: 1.8 (ref 1.2–2.2)
Albumin: 4.4 g/dL (ref 4.0–5.0)
Alkaline Phosphatase: 67 IU/L (ref 44–121)
BUN/Creatinine Ratio: 14 (ref 9–20)
BUN: 14 mg/dL (ref 6–24)
Bilirubin Total: 0.4 mg/dL (ref 0.0–1.2)
CO2: 22 mmol/L (ref 20–29)
Calcium: 9.4 mg/dL (ref 8.7–10.2)
Chloride: 102 mmol/L (ref 96–106)
Creatinine, Ser: 1.03 mg/dL (ref 0.76–1.27)
Globulin, Total: 2.4 g/dL (ref 1.5–4.5)
Glucose: 103 mg/dL — ABNORMAL HIGH (ref 65–99)
Potassium: 4 mmol/L (ref 3.5–5.2)
Sodium: 138 mmol/L (ref 134–144)
Total Protein: 6.8 g/dL (ref 6.0–8.5)
eGFR: 92 mL/min/{1.73_m2} (ref >59)

## 2020-06-26 LAB — VITAMIN D 25 HYDROXY (VIT D DEFICIENCY, FRACTURES): Vit D, 25-Hydroxy: 18.4 ng/mL — ABNORMAL LOW (ref 30.0–100.0)

## 2020-06-26 MED ORDER — VITAMIN D (ERGOCALCIFEROL) 1.25 MG (50000 UNIT) PO CAPS: 50000.0000 [IU] | ORAL_CAPSULE | ORAL | 0 refills | Status: DC

## 2020-07-01 NOTE — Progress Notes (Signed)
My chart returned suggesting he did not get his result or recommendation. Please see if he did, thanks.

## 2020-07-18 ENCOUNTER — Other Ambulatory Visit: Payer: Self-pay | Admitting: Family Medicine

## 2020-07-22 ENCOUNTER — Other Ambulatory Visit: Payer: Self-pay | Admitting: Family Medicine

## 2020-08-22 ENCOUNTER — Other Ambulatory Visit: Payer: Self-pay | Admitting: Family Medicine

## 2020-08-23 ENCOUNTER — Other Ambulatory Visit: Payer: Self-pay | Admitting: Family Medicine

## 2020-08-23 DIAGNOSIS — J4520 Mild intermittent asthma, uncomplicated: Secondary | ICD-10-CM

## 2020-08-29 ENCOUNTER — Encounter: Payer: Self-pay | Admitting: Family Medicine

## 2020-08-29 ENCOUNTER — Ambulatory Visit (INDEPENDENT_AMBULATORY_CARE_PROVIDER_SITE_OTHER): Payer: 59 | Admitting: Family Medicine

## 2020-08-29 ENCOUNTER — Other Ambulatory Visit: Payer: Self-pay

## 2020-08-29 VITALS — BP 120/68 | HR 77 | Ht 70.75 in | Wt 245.0 lb

## 2020-08-29 DIAGNOSIS — Z0000 Encounter for general adult medical examination without abnormal findings: Secondary | ICD-10-CM | POA: Diagnosis not present

## 2020-08-29 DIAGNOSIS — Z13220 Encounter for screening for lipoid disorders: Secondary | ICD-10-CM | POA: Diagnosis not present

## 2020-08-29 DIAGNOSIS — Z113 Encounter for screening for infections with a predominantly sexual mode of transmission: Secondary | ICD-10-CM | POA: Diagnosis not present

## 2020-08-29 DIAGNOSIS — M67431 Ganglion, right wrist: Secondary | ICD-10-CM

## 2020-08-29 DIAGNOSIS — E669 Obesity, unspecified: Secondary | ICD-10-CM

## 2020-08-29 DIAGNOSIS — Z833 Family history of diabetes mellitus: Secondary | ICD-10-CM | POA: Diagnosis not present

## 2020-08-29 DIAGNOSIS — Z1329 Encounter for screening for other suspected endocrine disorder: Secondary | ICD-10-CM

## 2020-08-29 DIAGNOSIS — R1084 Generalized abdominal pain: Secondary | ICD-10-CM

## 2020-08-29 DIAGNOSIS — I10 Essential (primary) hypertension: Secondary | ICD-10-CM

## 2020-08-29 DIAGNOSIS — Z23 Encounter for immunization: Secondary | ICD-10-CM

## 2020-08-29 NOTE — Patient Instructions (Signed)
I recommend scheduling an eye and dental exam.   We will be in touch with your results.    Preventive Care 45-45 Years Old, Male Preventive care refers to lifestyle choices and visits with your health care provider that can promote health and wellness. This includes: A yearly physical exam. This is also called an annual wellness visit. Regular dental and eye exams. Immunizations. Screening for certain conditions. Healthy lifestyle choices, such as: Eating a healthy diet. Getting regular exercise. Not using drugs or products that contain nicotine and tobacco. Limiting alcohol use. What can I expect for my preventive care visit? Physical exam Your health care provider will check your: Height and weight. These may be used to calculate your BMI (body mass index). BMI is a measurement that tells if you are at a healthy weight. Heart rate and blood pressure. Body temperature. Skin for abnormal spots. Counseling Your health care provider may ask you questions about your: Past medical problems. Family's medical history. Alcohol, tobacco, and drug use. Emotional well-being. Home life and relationship well-being. Sexual activity. Diet, exercise, and sleep habits. Work and work Statistician. Access to firearms. What immunizations do I need?  Vaccines are usually given at various ages, according to a schedule. Your health care provider will recommend vaccines for you based on your age, medicalhistory, and lifestyle or other factors, such as travel or where you work. What tests do I need? Blood tests Lipid and cholesterol levels. These may be checked every 5 years, or more often if you are over 44 years old. Hepatitis C test. Hepatitis B test. Screening Lung cancer screening. You may have this screening every year starting at age 25 if you have a 30-pack-year history of smoking and currently smoke or have quit within the past 15 years. Prostate cancer screening. Recommendations will  vary depending on your family history and other risks. Genital exam to check for testicular cancer or hernias. Colorectal cancer screening. All adults should have this screening starting at age 67 and continuing until age 49. Your health care provider may recommend screening at age 58 if you are at increased risk. You will have tests every 1-10 years, depending on your results and the type of screening test. Diabetes screening. This is done by checking your blood sugar (glucose) after you have not eaten for a while (fasting). You may have this done every 1-3 years. STD (sexually transmitted disease) testing, if you are at risk. Follow these instructions at home: Eating and drinking  Eat a diet that includes fresh fruits and vegetables, whole grains, lean protein, and low-fat dairy products. Take vitamin and mineral supplements as recommended by your health care provider. Do not drink alcohol if your health care provider tells you not to drink. If you drink alcohol: Limit how much you have to 0-2 drinks a day. Be aware of how much alcohol is in your drink. In the [place] one drink equals one 12 oz bottle of beer (355 mL), one 5 oz glass of wine (148 mL), or one 1 oz glass of hard liquor (44 mL).  Lifestyle Take daily care of your teeth and gums. Brush your teeth every morning and night with fluoride toothpaste. Floss one time each day. Stay active. Exercise for at least 30 minutes 5 or more days each week. Do not use any products that contain nicotine or tobacco, such as cigarettes, e-cigarettes, and chewing tobacco. If you need help quitting, ask your health care provider. Do not use drugs. If you are sexually  active, practice safe sex. Use a condom or other form of protection to prevent STIs (sexually transmitted infections). If told by your health care provider, take low-dose aspirin daily starting at age 19. Find healthy ways to cope with stress, such as: Meditation, yoga, or listening  to music. Journaling. Talking to a trusted person. Spending time with friends and family. Safety Always wear your seat belt while driving or riding in a vehicle. Do not drive: If you have been drinking alcohol. Do not ride with someone who has been drinking. When you are tired or distracted. While texting. Wear a helmet and other protective equipment during sports activities. If you have firearms in your house, make sure you follow all gun safety procedures. What's next? Go to your health care provider once a year for an annual wellness visit. Ask your health care provider how often you should have your eyes and teeth checked. Stay up to date on all vaccines. This information is not intended to replace advice given to you by your health care provider. Make sure you discuss any questions you have with your healthcare provider. Document Revised: 11/28/2018 Document Reviewed: 02/23/2018 Elsevier Patient Education  2022 [employer].

## 2020-08-29 NOTE — Progress Notes (Signed)
Subjective:    Patient ID: Antonio Sanchez, male    DOB: 08/25/1975, 45 y.o.   MRN: [id number]  HPI Chief Complaint  Patient presents with   cpe    Cpe, knot on wrist. Sometimes will get a sharp pain in abdominal, been going on over a year and half. Last episode was 2 weeks ago   He is here for a complete physical exam.  Reports having a knot on his right wrist for several months. States it is not bothersome.    States he has a 1 year history of intermittent brief episodes of sharp pain in his abdomen. He has noticed it in different situations. States one episode occurred when he needed to have a bowel movement and the pain was resolved after the bowel movement. States he has also noticed the pain when doing core exercises. Denies pain today.     Social history: Lives alone,  works as Glass blower/designer   Diet: fairly healthy  Exercise: 4-5 days per week   Immunizations: Covid 3 vaccines   Health maintenance:   Colonoscopy: never  Last PSA: never  Last Dental Exam: year ago  Last Eye Exam: years ago   Wears seatbelt always, smoke detectors in home and functioning, does not text while driving, feels safe in home environment.  Reviewed allergies, medications, past medical, surgical, family, and social history.     Review of Systems Review of Systems Constitutional: -fever, -chills, -sweats, -unexpected weight change,-fatigue ENT: -runny nose, -ear pain, -sore throat Cardiology:  -chest pain, -palpitations, -edema Respiratory: -cough, -shortness of breath, -wheezing Gastroenterology: +abdominal pain, -nausea, -vomiting, -diarrhea, -constipation  Hematology: -bleeding or bruising problems Musculoskeletal: -arthralgias, -myalgias, -joint swelling, -back pain Ophthalmology: -vision changes Urology: -dysuria, -difficulty urinating, -hematuria, -urinary frequency, -urgency Neurology: -headache, -weakness, -tingling, -numbness       Objective:   Physical Exam BP 120/68    Pulse 77   Ht 5' 10.75" (1.797 m)   Wt 245 lb (111.1 kg)   BMI 34.41 kg/m   General Appearance:    Alert, cooperative, no distress, appears stated age  Head:    Normocephalic, without obvious abnormality, atraumatic  Eyes:    PERRL, conjunctiva/corneas clear, EOM's intact  Ears:    Normal TM's and external ear canals  Nose:   Mask on   Throat:   Mask on   Neck:   Supple, no lymphadenopathy;  thyroid:  no   enlargement/tenderness/nodules; no JVD  Back:    Spine nontender, no curvature, ROM normal, no CVA     tenderness  Lungs:     Clear to auscultation bilaterally without wheezes, rales or     ronchi; respirations unlabored  Chest Wall:    No tenderness or deformity   Heart:    Regular rate and rhythm, S1 and S2 normal, no murmur, rub   or gallop  Breast Exam:    No chest wall tenderness, masses or gynecomastia  Abdomen:     Soft, non-tender, nondistended, normoactive bowel sounds,    no masses, no hepatosplenomegaly  Genitalia:    Normal male external genitalia without lesions.  Testicles without masses.  No inguinal hernias.     Extremities:   No clubbing, cyanosis or edema.  Right medial wrist with a smooth, round, movable, nontender mass consistent with ganglion cyst.  Normal range of motion in right wrist  Pulses:   2+ and symmetric all extremities  Skin:   Skin color, texture, turgor normal, no rashes or lesions  Lymph  nodes:   Cervical, supraclavicular, and axillary nodes normal  Neurologic:   CNII-XII intact, normal strength, sensation and gait          Psych:   Normal mood, affect, hygiene and grooming.        Assessment & Plan:  Routine general medical examination at a health care facility - Plan: Lipid panel, TSH, T4, free -Preventive health care reviewed.  Counseling on healthy lifestyle including diet and exercise.  Recommend regular dental and eye exams and he is overdue.  Immunizations reviewed.  Discussed safety and health promotion.  Ganglion cyst of wrist,  right -Discussed with this is most likely a benign ganglion cyst and since he is not having any symptoms we will do watchful waiting.  He will let me know if it becomes an issue and I will refer him to a hand specialist  Family history of diabetes mellitus in first degree relative - Plan: Hemoglobin A1c -Recommend a low sugar, low carbohydrate diet and getting adequate physical activity to prevent diabetes.  Increased risk due to family history  Essential hypertension -Well-controlled.  Continue medication  Screening for lipid disorders - Plan: Lipid panel  Screening for thyroid disorder - Plan: TSH, T4, free  Screen for STD (sexually transmitted disease) - Plan: RPR, Hepatitis C antibody, HIV Antibody (routine testing w rflx), GC/Chlamydia Probe Amp, Trichomonas vaginalis, RNA -Done per patient request  Need for diphtheria-tetanus-pertussis (Tdap) vaccine - Plan: Tdap vaccine greater than or equal to 7yo IM -Counseling done on all components of this vaccine  Obesity (BMI 30-39.9) - Plan: Lipid panel, TSH, T4, free, Hemoglobin A1c -Recommend cutting back on calories, carbohydrates, foods high in fat and increasing physical activity  Intermittent generalized abdominal pain -Discussed pain closer attention to triggers and symptoms.  This has been ongoing for approximately 1 year without worsening.  He will follow-up if he notices any new or worsening symptoms

## 2020-08-30 LAB — HEPATITIS C ANTIBODY: Hep C Virus Ab: <0.1 s/co ratio (ref 0.0–0.9)

## 2020-08-30 LAB — LIPID PANEL
Chol/HDL Ratio: 1.9 ratio (ref 0.0–5.0)
Cholesterol, Total: 122 mg/dL (ref 100–199)
HDL: 63 mg/dL (ref >39)
LDL Chol Calc (NIH): 46 mg/dL (ref 0–99)
Triglycerides: 63 mg/dL (ref 0–149)
VLDL Cholesterol Cal: 13 mg/dL (ref 5–40)

## 2020-08-30 LAB — T4, FREE: Free T4: 1.32 ng/dL (ref 0.82–1.77)

## 2020-08-30 LAB — HEMOGLOBIN A1C
Est. average glucose Bld gHb Est-mCnc: 111 mg/dL
Hgb A1c MFr Bld: 5.5 % (ref 4.8–5.6)

## 2020-08-30 LAB — HIV ANTIBODY (ROUTINE TESTING W REFLEX): HIV Screen 4th Generation wRfx: NONREACTIVE

## 2020-08-30 LAB — TSH: TSH: 0.535 u[IU]/mL (ref 0.450–4.500)

## 2020-08-30 LAB — RPR: RPR Ser Ql: NONREACTIVE

## 2020-08-31 LAB — TRICHOMONAS VAGINALIS, PROBE AMP: Trich vag by NAA: NEGATIVE

## 2020-08-31 LAB — GC/CHLAMYDIA PROBE AMP
Chlamydia trachomatis, NAA: NEGATIVE
Neisseria Gonorrhoeae by PCR: NEGATIVE

## 2020-09-03 ENCOUNTER — Encounter: Payer: 59 | Admitting: Family Medicine

## 2020-09-29 ENCOUNTER — Other Ambulatory Visit: Payer: Self-pay | Admitting: Family Medicine

## 2020-10-22 ENCOUNTER — Other Ambulatory Visit: Payer: Self-pay

## 2020-10-22 MED ORDER — OMEPRAZOLE 20 MG PO CPDR: 20.0000 mg | DELAYED_RELEASE_CAPSULE | Freq: Every morning | ORAL | 1 refills | Status: DC

## 2021-01-02 ENCOUNTER — Other Ambulatory Visit: Payer: Self-pay | Admitting: Family Medicine

## 2021-01-12 ENCOUNTER — Other Ambulatory Visit: Payer: Self-pay

## 2021-01-12 MED ORDER — OMEPRAZOLE 20 MG PO CPDR: 20.0000 mg | DELAYED_RELEASE_CAPSULE | Freq: Every morning | ORAL | 1 refills | Status: DC

## 2021-03-24 ENCOUNTER — Other Ambulatory Visit: Payer: Self-pay | Admitting: Medical

## 2021-04-21 ENCOUNTER — Encounter: Payer: 59 | Admitting: Physician Assistant

## 2021-04-27 ENCOUNTER — Other Ambulatory Visit: Payer: Self-pay | Admitting: Medical

## 2021-04-27 NOTE — Telephone Encounter (Signed)
Looks like [facility]

## 2021-05-01 ENCOUNTER — Other Ambulatory Visit: Payer: Self-pay

## 2021-05-01 ENCOUNTER — Ambulatory Visit: Payer: Medicaid Other | Admitting: Physician Assistant

## 2021-05-01 ENCOUNTER — Encounter: Payer: Self-pay | Admitting: Physician Assistant

## 2021-05-01 VITALS — BP 120/80 | HR 72 | Temp 97.5°F | Ht 71.0 in | Wt 246.2 lb

## 2021-05-01 DIAGNOSIS — I10 Essential (primary) hypertension: Secondary | ICD-10-CM

## 2021-05-01 DIAGNOSIS — K219 Gastro-esophageal reflux disease without esophagitis: Secondary | ICD-10-CM

## 2021-05-01 DIAGNOSIS — Z1211 Encounter for screening for malignant neoplasm of colon: Secondary | ICD-10-CM

## 2021-05-01 DIAGNOSIS — E559 Vitamin D deficiency, unspecified: Secondary | ICD-10-CM

## 2021-05-01 MED ORDER — OMEPRAZOLE 20 MG PO CPDR: DELAYED_RELEASE_CAPSULE | ORAL | 3 refills | Status: DC

## 2021-05-01 MED ORDER — LISINOPRIL 10 MG PO TABS: ORAL_TABLET | ORAL | 3 refills | Status: DC

## 2021-05-01 NOTE — Progress Notes (Signed)
Acute Office Visit  Subjective:    Patient ID: Antonio Sanchez, male    DOB: [birth year], 46 y.o.   MRN: [id number]  Chief Complaint  Patient presents with   Follow-up   Hypertension   Gastroesophageal Reflux    HPI Patient is in today for a follow up appointment. Reports he is compliant with his medicine and denies any new issues. Patient prefers to  wait until June to have labs checked.  Past Medical History:  Diagnosis Date   Acid reflux    Allergic rhinitis    Asthma    Corneal abrasion, left    Depression    Gastroesophageal reflux disease 06/25/2020   GERD (gastroesophageal reflux disease)    Headache(784.0)    Obesity    Vitamin D deficiency     Past Surgical History:  Procedure Laterality Date   dilated esophagus     WISDOM TOOTH EXTRACTION      Family History  Problem Relation Age of Onset   Diabetes Mother    Hypertension Father    Diabetes Other        Both sides   Diabetes Maternal Uncle     Social History   Socioeconomic History   Marital status: Single    Spouse name: Not on file   Number of children: 2   Years of education: Not on file   Highest education level: Not on file  Occupational History   Not on file  Tobacco Use   Smoking status: Former    Types: Cigarettes   Smokeless tobacco: Never  Substance and Sexual Activity   Alcohol use: No    Comment: social   Drug use: Yes    Types: Marijuana    Comment: occ   Sexual activity: Yes    Partners: Female  Other Topics Concern   Not on file  Social History Narrative   Lives w/girlfriend - uneployed   2 sons born 2001, 2003   Social Determinants of Health   Financial Resource Strain: Not on file  Food Insecurity: Not on file  Transportation Needs: Not on file  Physical Activity: Not on file  Stress: Not on file  Social Connections: Not on file  Intimate Partner Violence: Not on file    Outpatient Medications Prior to Visit  Medication Sig Dispense Refill   VITAMIN D PO  Take by mouth.     lisinopril (ZESTRIL) 10 MG tablet TAKE 1 TABLET(10 MG) BY MOUTH DAILY 30 tablet 0   omeprazole (PRILOSEC) 20 MG capsule TAKE 1 CAPSULE(20 MG) BY MOUTH EVERY MORNING 30 capsule 0   albuterol (VENTOLIN HFA) 108 (90 Base) MCG/ACT inhaler INHALE 2 PUFFS INTO THE LUNGS EVERY 6 HOURS AS NEEDED FOR WHEEZING OR SHORTNESS OF BREATH (Patient not taking: Reported on 05/01/2021) 18 g 0   Vitamin D, Ergocalciferol, (DRISDOL) 1.25 MG (50000 UNIT) CAPS capsule Take 1 capsule (50,000 Units total) by mouth every 7 (seven) days. (Patient not taking: Reported on 05/01/2021) 12 capsule 0   No facility-administered medications prior to visit.    Allergies  Allergen Reactions   Other Itching and Swelling    Peaches ,,apples ,Cherries    Review of Systems  Constitutional:  Negative for activity change, chills, fatigue and fever.  HENT:  Negative for congestion, ear pain, hearing loss and voice change.   Eyes:  Negative for pain and redness.  Respiratory:  Negative for cough and shortness of breath.   Cardiovascular:  Negative for leg swelling.  Gastrointestinal:  Negative for constipation, diarrhea, nausea and vomiting.  Endocrine: Negative for polyuria.  Genitourinary:  Negative for flank pain and frequency.  Musculoskeletal:  Negative for joint swelling and neck pain.  Skin:  Negative for rash.  Neurological:  Negative for dizziness.  Hematological:  Does not bruise/bleed easily.  Psychiatric/Behavioral:  Negative for agitation and behavioral problems.       Objective:    Physical Exam Vitals and nursing note reviewed.  Constitutional:      General: He is not in acute distress.    Appearance: Normal appearance.  HENT:     Head: Normocephalic and atraumatic.     Right Ear: Tympanic membrane, ear canal and external ear normal.     Left Ear: Tympanic membrane, ear canal and external ear normal.     Nose: No congestion.  Eyes:     Extraocular Movements: Extraocular movements  intact.     Conjunctiva/sclera: Conjunctivae normal.     Pupils: Pupils are equal, round, and reactive to light.  Neck:     Vascular: No carotid bruit.  Cardiovascular:     Rate and Rhythm: Normal rate and regular rhythm.     Pulses: Normal pulses.     Heart sounds: Normal heart sounds.  Pulmonary:     Effort: Pulmonary effort is normal.     Breath sounds: Normal breath sounds. No wheezing.  Abdominal:     General: Bowel sounds are normal.     Palpations: Abdomen is soft.  Musculoskeletal:        General: Normal range of motion.     Cervical back: Normal range of motion and neck supple.     Right lower leg: No edema.     Left lower leg: No edema.  Skin:    General: Skin is warm and dry.     Findings: No rash.  Neurological:     Mental Status: He is alert and oriented to person, place, and time.     Gait: Gait normal.  Psychiatric:        Mood and Affect: Mood normal.        Behavior: Behavior normal.    BP 120/80    Pulse 72    Temp (!) 97.5 F (36.4 C)    Ht _0  (1.803 m)    Wt 246 lb 3.2 oz (111.7 kg)    SpO2 98%    BMI 34.34 kg/m   Wt Readings from Last 3 Encounters:  05/01/21 246 lb 3.2 oz (111.7 kg)  08/29/20 245 lb (111.1 kg)  06/25/20 253 lb 12.8 oz (115.1 kg)    Health Maintenance Due  Topic Date Due   Fecal DNA (Cologuard)  Never done    There are no preventive care reminders to display for this patient.   Lab Results  Component Value Date   TSH 0.535 08/29/2020   Lab Results  Component Value Date   WBC 4.1 06/25/2020   HGB 14.8 06/25/2020   HCT 43.1 06/25/2020   MCV 87 06/25/2020   PLT 211 06/25/2020   Lab Results  Component Value Date   NA 138 06/25/2020   K 4.0 06/25/2020   CO2 22 06/25/2020   GLUCOSE 103 (H) 06/25/2020   BUN 14 06/25/2020   CREATININE 1.03 06/25/2020   BILITOT 0.4 06/25/2020   ALKPHOS 67 06/25/2020   AST 20 06/25/2020   ALT 32 06/25/2020   PROT 6.8 06/25/2020   ALBUMIN 4.4 06/25/2020   CALCIUM 9.4 06/25/2020    ANIONGAP  12 10/30/2019   EGFR 92 06/25/2020   Lab Results  Component Value Date   CHOL 122 08/29/2020   Lab Results  Component Value Date   HDL 63 08/29/2020   Lab Results  Component Value Date   LDLCALC 46 08/29/2020   Lab Results  Component Value Date   TRIG 63 08/29/2020   Lab Results  Component Value Date   CHOLHDL 1.9 08/29/2020   Lab Results  Component Value Date   HGBA1C 5.5 08/29/2020       Assessment & Plan:   Problem List Items Addressed This Visit       Cardiovascular and Mediastinum   Essential hypertension - Primary   Relevant Medications   lisinopril (ZESTRIL) 10 MG tablet     Digestive   GERD without esophagitis   Relevant Medications   omeprazole (PRILOSEC) 20 MG capsule     Other   Vitamin D deficiency   Other Visit Diagnoses     Colon cancer screening       Relevant Orders   Cologuard        Meds ordered this encounter  Medications   lisinopril (ZESTRIL) 10 MG tablet    Sig: TAKE 1 TABLET(10 MG) BY MOUTH DAILY    Dispense:  90 tablet    Refill:  3   omeprazole (PRILOSEC) 20 MG capsule    Sig: TAKE 1 CAPSULE(20 MG) BY MOUTH EVERY MORNING    Dispense:  90 capsule    Refill:  3   Return in June 2023 for annual exam.  Irene Pap, PA-C

## 2021-05-05 ENCOUNTER — Encounter: Payer: Self-pay | Admitting: Physician Assistant

## 2021-05-15 ENCOUNTER — Ambulatory Visit: Payer: Medicaid Other | Admitting: Internal Medicine

## 2021-06-11 LAB — COLOGUARD: COLOGUARD: POSITIVE — AB

## 2021-06-12 ENCOUNTER — Other Ambulatory Visit: Payer: Self-pay | Admitting: Physician Assistant

## 2021-06-12 DIAGNOSIS — R195 Other fecal abnormalities: Secondary | ICD-10-CM

## 2021-07-29 ENCOUNTER — Encounter: Payer: Self-pay | Admitting: Physician Assistant

## 2021-09-02 ENCOUNTER — Encounter: Payer: Self-pay | Admitting: Physician Assistant

## 2021-09-02 ENCOUNTER — Ambulatory Visit (INDEPENDENT_AMBULATORY_CARE_PROVIDER_SITE_OTHER): Payer: Medicaid Other | Admitting: Physician Assistant

## 2021-09-02 VITALS — BP 130/80 | HR 67 | Ht 71.0 in | Wt 260.8 lb

## 2021-09-02 DIAGNOSIS — K219 Gastro-esophageal reflux disease without esophagitis: Secondary | ICD-10-CM | POA: Diagnosis not present

## 2021-09-02 DIAGNOSIS — I10 Essential (primary) hypertension: Secondary | ICD-10-CM | POA: Diagnosis not present

## 2021-09-02 DIAGNOSIS — Z0000 Encounter for general adult medical examination without abnormal findings: Secondary | ICD-10-CM | POA: Diagnosis not present

## 2021-09-02 DIAGNOSIS — E559 Vitamin D deficiency, unspecified: Secondary | ICD-10-CM | POA: Diagnosis not present

## 2021-09-02 NOTE — Progress Notes (Unsigned)
Complete physical exam   Patient: Antonio Sanchez   DOB: [birth year]   46 y.o. Male  MRN: [id number] Visit Date: 09/02/2021  Chief Complaint  Patient presents with   Annual Exam    None Fasting Cpe- No other concerns   Subjective    Antonio Sanchez is a 46 y.o. male who presents today for a complete physical exam.   Reports is generally feeling well; is eating a regular diet; is sleeping well 3 - 4, naps during the day sometimes, has trouble staying asleep, works 1st shift, admits to por sleep habits; doesn't drink caffeine; drinks 5 -6 cups bottles of water a day; is not exercising now, last year was going to the gym but stopped  Not fasting, ate lunch 11:45 am, about 2 1/2 hours ago  Bloating after eating for 4 - 5 months; isn't sure if a certain type of food is causing it; is still taking omeprazole and probiotics  HPI HPI     Annual Exam    Additional comments: None Fasting Cpe- No other concerns      Last edited by Deforest Hoyles, CMA on 09/02/2021  1:40 PM.        Past Medical History:  Diagnosis Date   Acid reflux    Allergic rhinitis    Asthma    Corneal abrasion, left    Depression    Gastroesophageal reflux disease 06/25/2020   GERD (gastroesophageal reflux disease)    Headache(784.0)    Obesity    Vitamin D deficiency    Past Surgical History:  Procedure Laterality Date   dilated esophagus     WISDOM TOOTH EXTRACTION     Social History   Socioeconomic History   Marital status: Single    Spouse name: Not on file   Number of children: 2   Years of education: Not on file   Highest education level: Not on file  Occupational History   Not on file  Tobacco Use   Smoking status: Former    Types: Cigarettes   Smokeless tobacco: Never  Substance and Sexual Activity   Alcohol use: No    Comment: social   Drug use: Yes    Types: Marijuana    Comment: occ   Sexual activity: Yes    Partners: Female  Other Topics Concern   Not on file  Social  History Narrative   Lives w/girlfriend - uneployed   2 sons born 2001, 2003   Social Determinants of Health   Financial Resource Strain: Not on file  Food Insecurity: Not on file  Transportation Needs: Not on file  Physical Activity: Not on file  Stress: Not on file  Social Connections: Not on file  Intimate Partner Violence: Not on file   Family Status  Relation Name Status   Mother  Alive   Father  Alive   Mat Uncle  Alive   Other  (Not Specified)   Neg Hx  (Not Specified)   Family History  Problem Relation Age of Onset   Diabetes Mother    Hyperlipidemia Mother    Hypertension Father    Stroke Father    Heart disease Father    Diabetes Maternal Uncle    Diabetes Other        Both sides   Colon cancer Neg Hx    Prostate cancer Neg Hx    Allergies  Allergen Reactions   Other Itching and Swelling    Peaches ,,apples ,Cherries    Patient Care  Team: Marcellina Millin as PCP - General (Physician Assistant)   Medications: Outpatient Medications Prior to Visit  Medication Sig Note   albuterol (VENTOLIN HFA) 108 (90 Base) MCG/ACT inhaler INHALE 2 PUFFS INTO THE LUNGS EVERY 6 HOURS AS NEEDED FOR WHEEZING OR SHORTNESS OF BREATH 09/02/2021: As needed   lisinopril (ZESTRIL) 10 MG tablet TAKE 1 TABLET(10 MG) BY MOUTH DAILY    omeprazole (PRILOSEC) 20 MG capsule TAKE 1 CAPSULE(20 MG) BY MOUTH EVERY MORNING    Probiotic Product (UP4 PROBIOTICS MENS PO) Take by mouth.    [DISCONTINUED] VITAMIN D PO Take by mouth. (Patient not taking: Reported on 09/02/2021)    [DISCONTINUED] Vitamin D, Ergocalciferol, (DRISDOL) 1.25 MG (50000 UNIT) CAPS capsule Take 1 capsule (50,000 Units total) by mouth every 7 (seven) days. (Patient not taking: Reported on 09/02/2021)    No facility-administered medications prior to visit.    Review of Systems  Constitutional:  Negative for activity change and fever.  HENT:  Negative for congestion, ear pain and voice change.   Eyes:  Negative for  redness.  Respiratory:  Negative for cough.   Cardiovascular:  Negative for chest pain.  Gastrointestinal:  Negative for constipation and diarrhea.  Endocrine: Negative for polyuria.  Genitourinary:  Negative for flank pain.  Musculoskeletal:  Negative for gait problem and neck stiffness.  Skin:  Negative for color change and rash.  Neurological:  Negative for dizziness.  Hematological:  Negative for adenopathy.  Psychiatric/Behavioral:  Negative for agitation, behavioral problems and confusion.     {Labs (Optional):23779}  The ASCVD Risk score (Arnett DK, et al., 2019) failed to calculate for the following reasons:   The valid total cholesterol range is 130 to 320 mg/dL   Objective    BP 130/80   Pulse 67   Ht 5' 11"  (1.803 m)   Wt 260 lb 12.8 oz (118.3 kg)   SpO2 96%   BMI 36.37 kg/m   {Show previous vital signs (optional):23777}   Physical Exam Vitals and nursing note reviewed.  Constitutional:      General: He is not in acute distress.    Appearance: Normal appearance.  HENT:     Head: Normocephalic and atraumatic.     Right Ear: Tympanic membrane, ear canal and external ear normal.     Left Ear: Tympanic membrane, ear canal and external ear normal.     Nose: No congestion.  Eyes:     Extraocular Movements: Extraocular movements intact.     Conjunctiva/sclera: Conjunctivae normal.     Pupils: Pupils are equal, round, and reactive to light.  Neck:     Vascular: No carotid bruit.  Cardiovascular:     Rate and Rhythm: Normal rate and regular rhythm.     Pulses: Normal pulses.     Heart sounds: Normal heart sounds.  Pulmonary:     Effort: Pulmonary effort is normal.     Breath sounds: Normal breath sounds. No wheezing.  Abdominal:     General: Bowel sounds are normal.     Palpations: Abdomen is soft.  Musculoskeletal:        General: Normal range of motion.     Cervical back: Normal range of motion and neck supple.     Right lower leg: No edema.     Left  lower leg: No edema.  Skin:    General: Skin is warm and dry.     Findings: No rash.  Neurological:     Mental Status: He is  alert and oriented to person, place, and time.     Gait: Gait normal.  Psychiatric:        Mood and Affect: Mood normal.        Behavior: Behavior normal.     ***  Last depression screening scores    09/02/2021    1:42 PM 08/29/2020    2:56 PM 06/25/2020    8:19 AM  PHQ 2/9 Scores  PHQ - 2 Score 0 0 0   Last fall risk screening    09/02/2021    1:42 PM  Fall Risk   Falls in the past year? 0  Number falls in past yr: 0  Injury with Fall? 0  Risk for fall due to : No Fall Risks  Follow up Falls evaluation completed     No results found for any visits on 09/02/21.  Assessment & Plan    Routine Health Maintenance and Physical Exam  Exercise Activities and Dietary recommendations  Goals   None     Immunization History  Administered Date(s) Administered   Influenza Whole 01/24/2009   Moderna Sars-Covid-2 Vaccination 07/01/2019, 07/30/2019, 05/16/2020   Tdap 08/29/2020    Health Maintenance  Topic Date Due   COVID-19 Vaccine (4 - Moderna series) 03/03/2022 (Originally 07/11/2020)   Fecal DNA (Cologuard)  06/03/2024   TETANUS/TDAP  08/30/2030   Hepatitis C Screening  Completed   HIV Screening  Completed   HPV VACCINES  Aged Out   INFLUENZA VACCINE  Discontinued    Discussed health benefits of physical activity, and encouraged him to engage in regular exercise appropriate for his age and condition.  Problem List Items Addressed This Visit       Cardiovascular and Mediastinum   Essential hypertension   Relevant Orders   Comprehensive metabolic panel   Lipid panel     Digestive   GERD without esophagitis   Relevant Medications   Probiotic Product (UP4 PROBIOTICS MENS PO)   Other Relevant Orders   CBC with Differential/Platelet     Other   Vitamin D deficiency   Relevant Orders   VITAMIN D 25 Hydroxy (Vit-D Deficiency,  Fractures)   Other Visit Diagnoses     Routine medical exam    -  Primary   Relevant Orders   CBC with Differential/Platelet   Comprehensive metabolic panel   Lipid panel        Return in about 6 months (around 03/04/2022) for Return for a Follow Up Appointment Chronic Disease.     Irene Pap, PA-C

## 2021-09-02 NOTE — Patient Instructions (Addendum)
Preventative Care for Adults, Male       REGULAR HEALTH EXAMS: A routine yearly physical is a good way to check in with your primary care provider about your health and preventive screening. It is also an opportunity to share updates about your health and any concerns you have, and receive a thorough all-over exam.  Most health insurance companies pay for at least some preventative services.  Check with your health plan for specific coverages.  WHAT PREVENTATIVE SERVICES DO MEN NEED? Adult men should have their weight and blood pressure checked regularly.  Men age 46 and older should have their cholesterol levels checked regularly. Beginning at age 20 and continuing to age 1, men should be screened for colorectal cancer.  Certain people should may need continued testing until age 13. Other cancer screening may include exams for testicular and prostate cancer. Updating vaccinations is part of preventative care.  Vaccinations help protect against diseases such as the flu. Lab tests are generally done as part of preventative care to screen for anemia and blood disorders, to screen for problems with the kidneys and liver, to screen for bladder problems, to check blood sugar, and to check your cholesterol level. Preventative services generally include counseling about diet, exercise, avoiding tobacco, drugs, excessive alcohol consumption, and sexually transmitted infections.    GENERAL RECOMMENDATIONS FOR GOOD HEALTH:  Healthy diet: Eat a variety of foods, including fruit, vegetables, animal or vegetable protein, such as meat, fish, chicken, and eggs, or beans, lentils, tofu, and grains, such as rice. Drink plenty of water daily (60 - 80 ounces or 8 - 10 glasses of water a day) Decrease saturated fat in the diet, avoid lots of red meat, processed foods, sweets, fast foods, and fried foods. For high cholesterol - Increase fiber intake (Benefiber or Metamucil, Cherrios,  oatmeal, beans, nuts, fruits  and vegetables), limit saturated fats (in fried foods, red meat), can add OTC fish oil supplement, eat fish with Omega-3 fatty acids like salmon and tuna, exercise for 30 minutes 3 - 5 times a week, drink 8 - 10 glasses of water a day.  Exercise: Aerobic exercise helps maintain good heart health. At least 30-40 minutes of moderate-intensity exercise is recommended. For example, a brisk walk that increases your heart rate and breathing. This should be done on most days of the week.  Find a type of exercise or a variety of exercises that you enjoy so that it becomes a part of your daily life.  Examples are running, walking, swimming, water aerobics, and biking.  For motivation and support, explore group exercise such as aerobic class, spin class, Zumba, Yoga,or  martial arts, etc.   Set exercise goals for yourself, such as a certain weight goal, walk or run in a race such as a 5k walk/run.  Speak to your primary care provider about exercise goals.  Disease prevention: If you smoke or chew tobacco, find out from your caregiver how to quit. It can literally save your life, no matter how long you have been a tobacco user. If you do not use tobacco, never begin.  Maintain a healthy diet and normal weight. Increased weight leads to problems with blood pressure and diabetes.  The Body Mass Index or BMI is a way of measuring how much of your body is fat. Having a BMI above 27 increases the risk of heart disease, diabetes, hypertension, stroke and other problems related to obesity. Your caregiver can help determine your BMI and based on it develop  an exercise and dietary program to help you achieve or maintain this important measurement at a healthful level. High blood pressure causes heart and blood vessel problems.  Persistent high blood pressure should be treated with medicine if weight loss and exercise do not work.  Fat and cholesterol leaves deposits in your arteries that can block them. This causes heart  disease and vessel disease elsewhere in your body.  If your cholesterol is found to be high, or if you have heart disease or certain other medical conditions, then you may need to have your cholesterol monitored frequently and be treated with medication.  Ask if you should have a stress test if your history suggests this. A stress test is a test done on a treadmill that looks for heart disease. This test can find disease prior to there being a problem. Avoid drinking alcohol in excess (more than two drinks per day).  Avoid use of street drugs. Do not share needles with anyone. Ask for professional help if you need assistance or instructions on stopping the use of alcohol, cigarettes, and/or drugs. Brush your teeth twice a day with fluoride toothpaste, and floss once a day. Good oral hygiene prevents tooth decay and gum disease. The problems can be painful, unattractive, and can cause other health problems. Visit your dentist for a routine oral and dental check up and preventive care every 6-12 months.  Look at your skin regularly.  Use a mirror to look at your back. Notify your caregivers of changes in moles, especially if there are changes in shapes, colors, a size larger than a pencil eraser, an irregular border, or development of new moles.  Safety: Use seatbelts 100% of the time, whether driving or as a passenger.  Use safety devices such as hearing protection if you work in environments with loud noise or significant background noise.  Use safety glasses when doing any work that could send debris in to the eyes.  Use a helmet if you ride a bike or motorcycle.  Use appropriate safety gear for contact sports.  Talk to your caregiver about gun safety. Use sunscreen with a SPF (or skin protection factor) of 15 or greater.  Lighter skinned people are at a greater risk of skin cancer. Don't forget to also wear sunglasses in order to protect your eyes from too much damaging sunlight. Damaging sunlight can  accelerate cataract formation.  Practice safe sex. Use condoms. Condoms are used for birth control and to help reduce the spread of sexually transmitted infections (or STIs).  Some of the STIs are gonorrhea (the clap), chlamydia, syphilis, trichomonas, herpes, HPV (human papilloma virus) and HIV (human immunodeficiency virus) which causes AIDS. The herpes, HIV and HPV are viral illnesses that have no cure. These can result in disability, cancer and death.  Keep carbon monoxide and smoke detectors in your home functioning at all times. Change the batteries every 6 months or use a model that plugs into the wall.   Vaccinations: Stay up to date with your tetanus shots and other required immunizations. You should have a booster for tetanus every 10 years. Be sure to get your flu shot every year, since 5%-20% of the [place] population comes down with the flu. The flu vaccine changes each year, so being vaccinated once is not enough. Get your shot in the fall, before the flu season peaks.   Other vaccines to consider: Pneumococcal vaccine to protect against certain types of pneumonia.  This is normally recommended for adults age  87 or older.  However, adults younger than 47 years old with certain underlying conditions such as diabetes, heart or lung disease should also receive the vaccine. Shingles vaccine to protect against Varicella Zoster if you are older than age 30, or younger than 46 years old with certain underlying illness. Hepatitis A vaccine to protect against a form of infection of the liver by a virus acquired from food. Hepatitis B vaccine to protect against a form of infection of the liver by a virus acquired from blood or body fluids, particularly if you work in health care. If you plan to travel internationally, check with your local health department for specific vaccination recommendations.  Cancer Screening: Most routine colon cancer screening begins at the age of 14. On a yearly basis,  doctors may provide special easy to use take-home tests to check for hidden blood in the stool. Sigmoidoscopy or colonoscopy can detect the earliest forms of colon cancer and is life saving. These tests use a small camera at the end of a tube to directly examine the colon. Speak to your caregiver about this at age 43, when routine screening begins (and is repeated every 5 years unless early forms of pre-cancerous polyps or small growths are found).  At the age of 41 men usually start screening for prostate cancer every year. Screening may begin at a younger age for those with higher risk. Those at higher risk include African-Americans or having a family history of prostate cancer. There are two types of tests for prostate cancer:  Prostate-specific antigen (PSA) testing. Recent studies raise questions about prostate cancer using PSA and you should discuss this with your caregiver.  Digital rectal exam (in which your doctor's lubricated and gloved finger feels for enlargement of the prostate through the anus).  Screening for testicular cancer.  Do a monthly exam of your testicles. Gently roll each testicle between your thumb and fingers, feeling for any abnormal lumps. The best time to do this is after a hot shower or bath when the tissues are looser. Notify your caregivers of any lumps, tenderness or changes in size or shape immediately.    To improve digestion of food and/or ease stomach discomfort, you can take any OTC probiotic and any OTC digestive enzyme. You can go to a store with a pharmacy and the staff can show you where these items are on the shelf.

## 2021-09-03 LAB — COMPREHENSIVE METABOLIC PANEL
ALT: 34 IU/L (ref 0–44)
AST: 24 IU/L (ref 0–40)
Albumin/Globulin Ratio: 2.2 (ref 1.2–2.2)
Albumin: 4.7 g/dL (ref 4.0–5.0)
Alkaline Phosphatase: 61 IU/L (ref 44–121)
BUN/Creatinine Ratio: 14 (ref 9–20)
BUN: 13 mg/dL (ref 6–24)
Bilirubin Total: 0.4 mg/dL (ref 0.0–1.2)
CO2: 25 mmol/L (ref 20–29)
Calcium: 9.6 mg/dL (ref 8.7–10.2)
Chloride: 103 mmol/L (ref 96–106)
Creatinine, Ser: 0.91 mg/dL (ref 0.76–1.27)
Globulin, Total: 2.1 g/dL (ref 1.5–4.5)
Glucose: 102 mg/dL — ABNORMAL HIGH (ref 70–99)
Potassium: 4.5 mmol/L (ref 3.5–5.2)
Sodium: 140 mmol/L (ref 134–144)
Total Protein: 6.8 g/dL (ref 6.0–8.5)
eGFR: 106 mL/min/{1.73_m2} (ref >59)

## 2021-09-03 LAB — CBC WITH DIFFERENTIAL/PLATELET
Basophils Absolute: 0 10*3/uL (ref 0.0–0.2)
Basos: 1 %
EOS (ABSOLUTE): 0.1 10*3/uL (ref 0.0–0.4)
Eos: 2 %
Hematocrit: 43.1 % (ref 37.5–51.0)
Hemoglobin: 14.7 g/dL (ref 13.0–17.7)
Immature Grans (Abs): 0 10*3/uL (ref 0.0–0.1)
Immature Granulocytes: 1 %
Lymphocytes Absolute: 2 10*3/uL (ref 0.7–3.1)
Lymphs: 47 %
MCH: 29.3 pg (ref 26.6–33.0)
MCHC: 34.1 g/dL (ref 31.5–35.7)
MCV: 86 fL (ref 79–97)
Monocytes Absolute: 0.3 10*3/uL (ref 0.1–0.9)
Monocytes: 8 %
Neutrophils Absolute: 1.7 10*3/uL (ref 1.4–7.0)
Neutrophils: 41 %
Platelets: 208 10*3/uL (ref 150–450)
RBC: 5.01 x10E6/uL (ref 4.14–5.80)
RDW: 12.5 % (ref 11.6–15.4)
WBC: 4.2 10*3/uL (ref 3.4–10.8)

## 2021-09-03 LAB — LIPID PANEL
Chol/HDL Ratio: 2.2 ratio (ref 0.0–5.0)
Cholesterol, Total: 134 mg/dL (ref 100–199)
HDL: 61 mg/dL (ref >39)
LDL Chol Calc (NIH): 57 mg/dL (ref 0–99)
Triglycerides: 80 mg/dL (ref 0–149)
VLDL Cholesterol Cal: 16 mg/dL (ref 5–40)

## 2021-09-03 LAB — VITAMIN D 25 HYDROXY (VIT D DEFICIENCY, FRACTURES): Vit D, 25-Hydroxy: 20.4 ng/mL — ABNORMAL LOW (ref 30.0–100.0)

## 2021-09-06 ENCOUNTER — Encounter: Payer: Self-pay | Admitting: Physician Assistant

## 2021-09-06 MED ORDER — VITAMIN D (ERGOCALCIFEROL) 1.25 MG (50000 UNIT) PO CAPS: 50000.0000 [IU] | ORAL_CAPSULE | ORAL | 1 refills | Status: AC

## 2021-09-06 NOTE — Assessment & Plan Note (Signed)
To improve digestion of food and/or ease stomach discomfort, you can take any OTC probiotic and any OTC digestive enzyme. You can go to a store with a pharmacy and the staff can show you where these items are on the shelf;  avoid stomach irritants (tomatoes, oranges, lemons, limes, spicy/greasy food)

## 2021-09-16 ENCOUNTER — Ambulatory Visit (AMBULATORY_SURGERY_CENTER): Payer: Self-pay | Admitting: *Deleted

## 2021-09-16 VITALS — Ht 71.0 in | Wt 263.0 lb

## 2021-09-16 DIAGNOSIS — R195 Other fecal abnormalities: Secondary | ICD-10-CM

## 2021-09-16 MED ORDER — PLENVU 140 G PO SOLR: 1.0000 | ORAL | 0 refills | Status: DC

## 2021-09-16 NOTE — Progress Notes (Signed)
No egg or soy allergy known to patient  No issues known to pt with past sedation with any surgeries or procedures Patient denies ever being told they had issues or difficulty with intubation  No FH of Malignant Hyperthermia Pt is not on diet pills Pt is not on  home 02  Pt is not on blood thinners  Pt denies issues with constipation  No A fib or A flutter  Pt given note for work.  PV completed in person. Pt verified name, DOB.  Procedure explained to pt. Prep instructions reviewed, questions answered. Pt encouraged to call with questions or issues.  If pt has My chart, procedure instructions sent via My Chart

## 2021-10-13 ENCOUNTER — Ambulatory Visit (AMBULATORY_SURGERY_CENTER): Payer: 59 | Admitting: Internal Medicine

## 2021-10-13 ENCOUNTER — Encounter: Payer: Self-pay | Admitting: Internal Medicine

## 2021-10-13 VITALS — BP 162/92 | HR 68 | Temp 99.1°F | Resp 13 | Ht 70.75 in | Wt 263.0 lb

## 2021-10-13 DIAGNOSIS — K649 Unspecified hemorrhoids: Secondary | ICD-10-CM

## 2021-10-13 DIAGNOSIS — D123 Benign neoplasm of transverse colon: Secondary | ICD-10-CM

## 2021-10-13 DIAGNOSIS — R195 Other fecal abnormalities: Secondary | ICD-10-CM

## 2021-10-13 DIAGNOSIS — D124 Benign neoplasm of descending colon: Secondary | ICD-10-CM | POA: Diagnosis not present

## 2021-10-13 DIAGNOSIS — D120 Benign neoplasm of cecum: Secondary | ICD-10-CM

## 2021-10-13 MED ORDER — SODIUM CHLORIDE 0.9 % IV SOLN: 500.0000 mL | Freq: Once | INTRAVENOUS | Status: DC

## 2021-10-13 NOTE — Progress Notes (Signed)
Sedate, gd SR, tolerated procedure well, VSS, report to RN 

## 2021-10-13 NOTE — Progress Notes (Signed)
Called to room to assist during endoscopic procedure.  Patient ID and intended procedure confirmed with present staff. Received instructions for my participation in the procedure from the performing physician.  

## 2021-10-13 NOTE — Progress Notes (Signed)
VS by [place]  Pt's states no medical or surgical changes since previsit or office visit.  

## 2021-10-13 NOTE — Op Note (Signed)
[facility] Patient Name: Antonio Sanchez Procedure Date: 10/13/2021 10:58 AM MRN: [id number] Endoscopist: Gatha Mayer , MD Age: 46 Referring MD:  Date of Birth: 21-Jan-1976 Gender: Male Account #: [id number] Procedure:                Colonoscopy Indications:              Positive Cologuard test Medicines:                Monitored Anesthesia Care Procedure:                Pre-Anesthesia Assessment:                           - Prior to the procedure, a History and Physical                            was performed, and patient medications and                            allergies were reviewed. The patient's tolerance of                            previous anesthesia was also reviewed. The risks                            and benefits of the procedure and the sedation                            options and risks were discussed with the patient.                            All questions were answered, and informed consent                            was obtained. Prior Anticoagulants: The patient has                            taken no previous anticoagulant or antiplatelet                            agents. ASA Grade Assessment: II - A patient with                            mild systemic disease. After reviewing the risks                            and benefits, the patient was deemed in                            satisfactory condition to undergo the procedure.                           After obtaining informed consent, the colonoscope  was passed under direct vision. Throughout the                            procedure, the patient's blood pressure, pulse, and                            oxygen saturations were monitored continuously. The                            Olympus CF-HQ190L [id number]) Colonoscope was                            introduced through the anus and advanced to the the                            cecum, identified by appendiceal orifice  and                            ileocecal valve. The colonoscopy was performed                            without difficulty. The patient tolerated the                            procedure well. The quality of the bowel                            preparation was good. The ileocecal valve,                            appendiceal orifice, and rectum were photographed.                            The bowel preparation used was Miralax via split                            dose instruction. Scope In: 11:09:37 AM Scope Out: 11:31:06 AM Scope Withdrawal Time: 0 hours 16 minutes 6 seconds  Total Procedure Duration: 0 hours 21 minutes 29 seconds  Findings:                 The perianal and digital rectal examinations were                            normal. Pertinent negatives include normal prostate                            (size, shape, and consistency).                           Three sessile polyps were found in the descending                            colon and cecum. The polyps were diminutive in  size. These polyps were removed with a cold snare.                            Resection and retrieval were complete. Verification                            of patient identification for the specimen was                            done. Estimated blood loss was minimal.                           Internal hemorrhoids were found.                           The exam was otherwise without abnormality on                            direct and retroflexion views. Complications:            No immediate complications. Estimated Blood Loss:     Estimated blood loss was minimal. Impression:               - Three diminutive polyps in the descending colon                            and in the cecum, removed with a cold snare.                            Resected and retrieved.                           - Internal hemorrhoids.                           - The examination was otherwise  normal on direct                            and retroflexion views. Recommendation:           - Patient has a contact number available for                            emergencies. The signs and symptoms of potential                            delayed complications were discussed with the                            patient. Return to normal activities tomorrow.                            Written discharge instructions were provided to the                            patient.                           -  Resume previous diet.                           - Continue present medications.                           - Repeat colonoscopy is recommended. The                            colonoscopy date will be determined after pathology                            results from today's exam become available for                            review. Gatha Mayer, MD 10/13/2021 11:39:26 AM This report has been signed electronically.

## 2021-10-13 NOTE — Progress Notes (Signed)
[facility] History and Physical   Primary Care Physician:  Marcellina Millin   Reason for Procedure:   + Cologuard test  Plan:    colonoscopy     HPI: Antonio Sanchez is a 46 y.o. male w/ + cologuard   Past Medical History:  Diagnosis Date   Acid reflux    Allergic rhinitis    Asthma    Corneal abrasion, left    Depression    Gastroesophageal reflux disease 06/25/2020   GERD (gastroesophageal reflux disease)    Headache(784.0)    Hypertension    Obesity    Vitamin D deficiency     Past Surgical History:  Procedure Laterality Date   dilated esophagus     WISDOM TOOTH EXTRACTION      Prior to Admission medications   Medication Sig Start Date End Date Taking? Authorizing Provider  lisinopril (ZESTRIL) 10 MG tablet TAKE 1 TABLET(10 MG) BY MOUTH DAILY 05/01/21  Yes Francis Gaines B, PA-C  omeprazole (PRILOSEC) 20 MG capsule TAKE 1 CAPSULE(20 MG) BY MOUTH EVERY MORNING 05/01/21  Yes Francis Gaines B, PA-C  albuterol (VENTOLIN HFA) 108 (90 Base) MCG/ACT inhaler INHALE 2 PUFFS INTO THE LUNGS EVERY 6 HOURS AS NEEDED FOR WHEEZING OR SHORTNESS OF BREATH 08/25/20   Henson, Vickie L, NP-C  Probiotic Product (UP4 PROBIOTICS MENS PO) Take by mouth.    [provider]  Vitamin D, Ergocalciferol, (DRISDOL) 1.25 MG (50000 UNIT) CAPS capsule Take 1 capsule (50,000 Units total) by mouth every 7 (seven) days. Patient not taking: Reported on 09/16/2021 09/06/21   Marcellina Millin    Current Outpatient Medications  Medication Sig Dispense Refill   lisinopril (ZESTRIL) 10 MG tablet TAKE 1 TABLET(10 MG) BY MOUTH DAILY 90 tablet 3   omeprazole (PRILOSEC) 20 MG capsule TAKE 1 CAPSULE(20 MG) BY MOUTH EVERY MORNING 90 capsule 3   albuterol (VENTOLIN HFA) 108 (90 Base) MCG/ACT inhaler INHALE 2 PUFFS INTO THE LUNGS EVERY 6 HOURS AS NEEDED FOR WHEEZING OR SHORTNESS OF BREATH 18 g 0   Probiotic Product (UP4 PROBIOTICS MENS PO) Take by mouth.     Vitamin D,  Ergocalciferol, (DRISDOL) 1.25 MG (50000 UNIT) CAPS capsule Take 1 capsule (50,000 Units total) by mouth every 7 (seven) days. (Patient not taking: Reported on 09/16/2021) 12 capsule 1   Current Facility-Administered Medications  Medication Dose Route Frequency Provider Last Rate Last Admin   0.9 %  sodium chloride infusion  500 mL Intravenous Once Gatha Mayer, MD        Allergies as of 10/13/2021 - Review Complete 10/13/2021  Allergen Reaction Noted   Other Itching and Swelling 10/31/2019    Family History  Problem Relation Age of Onset   Colon polyps Mother    Diabetes Mother    Hyperlipidemia Mother    Hypertension Father    Stroke Father    Heart disease Father    Diabetes Maternal Uncle    Diabetes Other        Both sides   Colon cancer Neg Hx    Prostate cancer Neg Hx    Esophageal cancer Neg Hx    Stomach cancer Neg Hx    Rectal cancer Neg Hx     Social History   Socioeconomic History   Marital status: Single    Spouse name: Not on file   Number of children: 2   Years of education: Not on file   Highest education level: Not on file  Occupational  History   Not on file  Tobacco Use   Smoking status: Former    Types: Cigarettes   Smokeless tobacco: Never  Vaping Use   Vaping Use: Never used  Substance and Sexual Activity   Alcohol use: No    Comment: social   Drug use: Yes    Types: Marijuana    Comment: occ   Sexual activity: Yes    Partners: Female  Other Topics Concern   Not on file  Social History Narrative   Lives w/girlfriend - uneployed   2 sons born 2001, 2003   Social Determinants of Health   Financial Resource Strain: Not on file  Food Insecurity: Not on file  Transportation Needs: Not on file  Physical Activity: Not on file  Stress: Not on file  Social Connections: Not on file  Intimate Partner Violence: Not on file    Review of Systems:  All other review of systems negative except as mentioned in the HPI.  Physical  Exam: Vital signs BP (!) 135/96   Pulse 71   Temp 99.1 F (37.3 C)   Ht 5' 10.75" (1.797 m)   Wt 263 lb (119.3 kg)   SpO2 100%   BMI 36.94 kg/m   General:   Alert,  Well-developed, well-nourished, pleasant and cooperative in NAD Lungs:  Clear throughout to auscultation.   Heart:  Regular rate and rhythm; no murmurs, clicks, rubs,  or gallops. Abdomen:  Soft, nontender and nondistended. Normal bowel sounds.   Neuro/Psych:  Alert and cooperative. Normal mood and affect. A and O x 3   '@Aracelie Addis'$  Simonne Maffucci, MD, [facility] [phone] (pager) 10/13/2021 10:59 AM@

## 2021-10-13 NOTE — Patient Instructions (Addendum)
I found and removed 3 tiny polyps. Also saw that hemorrhoids were swollen (hemorrhoids are normal structures).  I will let you know pathology results and when to have another routine colonoscopy by mail and/or My Chart.  I appreciate the opportunity to care for you. Gatha Mayer, MD, [employer]   Information on polyps and hemorrhoids given to you today.  Await pathology results.  Resume previous diet and medications.  YOU HAD AN ENDOSCOPIC PROCEDURE TODAY AT [facility]:   Refer to the procedure report that was given to you for any specific questions about what was found during the examination.  If the procedure report does not answer your questions, please call your gastroenterologist to clarify.  If you requested that your care partner not be given the details of your procedure findings, then the procedure report has been included in a sealed envelope for you to review at your convenience later.  YOU SHOULD EXPECT: Some feelings of bloating in the abdomen. Passage of more gas than usual.  Walking can help get rid of the air that was put into your GI tract during the procedure and reduce the bloating. If you had a lower endoscopy (such as a colonoscopy or flexible sigmoidoscopy) you may notice spotting of blood in your stool or on the toilet paper. If you underwent a bowel prep for your procedure, you may not have a normal bowel movement for a few days.  Please Note:  You might notice some irritation and congestion in your nose or some drainage.  This is from the oxygen used during your procedure.  There is no need for concern and it should clear up in a day or so.  SYMPTOMS TO REPORT IMMEDIATELY:  Following lower endoscopy (colonoscopy or flexible sigmoidoscopy):  Excessive amounts of blood in the stool  Significant tenderness or worsening of abdominal pains  Swelling of the abdomen that is new, acute  Fever of 100F or higher   For urgent or emergent issues, a  gastroenterologist can be reached at any hour by calling [phone]. Do not use MyChart messaging for urgent concerns.    DIET:  We do recommend a small meal at first, but then you may proceed to your regular diet.  Drink plenty of fluids but you should avoid alcoholic beverages for 24 hours.  ACTIVITY:  You should plan to take it easy for the rest of today and you should NOT DRIVE or use heavy machinery until tomorrow (because of the sedation medicines used during the test).    FOLLOW UP: Our staff will call the number listed on your records the next business day following your procedure.  We will call around 7:15- 8:00 am to check on you and address any questions or concerns that you may have regarding the information given to you following your procedure. If we do not reach you, we will leave a message.  If you develop any symptoms (ie: fever, flu-like symptoms, shortness of breath, cough etc.) before then, please call [phone].  If you test positive for Covid 19 in the 2 weeks post procedure, please call and report this information to [place].    If any biopsies were taken you will be contacted by phone or by letter within the next 1-3 weeks.  Please call us at [phone] if you have not heard about the biopsies in 3 weeks.    SIGNATURES/CONFIDENTIALITY: You and/or your care partner have signed paperwork which will be entered into your electronic medical  record.  These signatures attest to the fact that that the information above on your After Visit Summary has been reviewed and is understood.  Full responsibility of the confidentiality of this discharge information lies with you and/or your care-partner.

## 2021-10-14 ENCOUNTER — Telehealth: Payer: Self-pay | Admitting: *Deleted

## 2021-10-14 NOTE — Telephone Encounter (Signed)
  Follow up Call-     10/13/2021   10:21 AM  Call back number  Post procedure Call Back phone  # [phone]  Permission to leave phone message Yes     Patient questions:  Do you have a fever, pain , or abdominal swelling? No. Pain Score  0 *  Have you tolerated food without any problems? Yes.    Have you been able to return to your normal activities? Yes.    Do you have any questions about your discharge instructions: Diet   No. Medications  No. Follow up visit  No.  Do you have questions or concerns about your Care? No.  Actions: * If pain score is 4 or above: No action needed, pain <4.

## 2021-10-19 ENCOUNTER — Encounter: Payer: Self-pay | Admitting: Internal Medicine

## 2021-10-19 DIAGNOSIS — Z860101 Personal history of adenomatous and serrated colon polyps: Secondary | ICD-10-CM

## 2021-10-19 DIAGNOSIS — Z86010 Personal history of colonic polyps: Secondary | ICD-10-CM | POA: Insufficient documentation

## 2021-10-19 HISTORY — DX: Personal history of adenomatous and serrated colon polyps: Z86.0101

## 2021-12-22 ENCOUNTER — Encounter: Payer: Self-pay | Admitting: Internal Medicine

## 2022-01-25 DIAGNOSIS — Z113 Encounter for screening for infections with a predominantly sexual mode of transmission: Secondary | ICD-10-CM | POA: Diagnosis not present

## 2022-01-26 ENCOUNTER — Encounter: Payer: Self-pay | Admitting: Internal Medicine

## 2022-02-12 ENCOUNTER — Emergency Department (HOSPITAL_COMMUNITY): Payer: Medicaid Other

## 2022-02-12 ENCOUNTER — Telehealth: Payer: Self-pay | Admitting: Nurse Practitioner

## 2022-02-12 ENCOUNTER — Encounter (HOSPITAL_COMMUNITY): Payer: Self-pay | Admitting: Emergency Medicine

## 2022-02-12 ENCOUNTER — Emergency Department (HOSPITAL_COMMUNITY)
Admission: EM | Admit: 2022-02-12 | Discharge: 2022-02-12 | Disposition: A | Discharge status: Home / Self Care | Payer: Medicaid Other | Attending: Emergency Medicine | Admitting: Emergency Medicine

## 2022-02-12 DIAGNOSIS — R1310 Dysphagia, unspecified: Secondary | ICD-10-CM | POA: Diagnosis not present

## 2022-02-12 DIAGNOSIS — K122 Cellulitis and abscess of mouth: Secondary | ICD-10-CM | POA: Insufficient documentation

## 2022-02-12 LAB — URINALYSIS, ROUTINE W REFLEX MICROSCOPIC
Bilirubin Urine: NEGATIVE
Glucose, UA: NEGATIVE mg/dL
Hgb urine dipstick: NEGATIVE
Ketones, ur: NEGATIVE mg/dL
Leukocytes,Ua: NEGATIVE
Nitrite: NEGATIVE
Protein, ur: NEGATIVE mg/dL
Specific Gravity, Urine: 1.024 (ref 1.005–1.030)
pH: 6 (ref 5.0–8.0)

## 2022-02-12 MED ORDER — AMOXICILLIN 500 MG PO CAPS
500.0000 mg | ORAL_CAPSULE | Freq: Once | ORAL | Status: AC
  Administered 2022-02-12: 500 mg via ORAL
  Filled 2022-02-12: qty 1

## 2022-02-12 MED ORDER — AMOXICILLIN 500 MG PO CAPS: 500.0000 mg | ORAL_CAPSULE | Freq: Three times a day (TID) | ORAL | 0 refills | Status: AC

## 2022-02-12 MED ORDER — KETOROLAC TROMETHAMINE 30 MG/ML IJ SOLN
30.0000 mg | Freq: Once | INTRAMUSCULAR | Status: AC
  Administered 2022-02-12: 30 mg via INTRAMUSCULAR
  Filled 2022-02-12: qty 1

## 2022-02-12 NOTE — ED Provider Notes (Signed)
[facility] Emergency Department Provider Note MRN:  [id number]  Arrival date & time: 02/12/22     Chief Complaint   Vomiting and trouble swallowing  History of Present Illness   Antonio Sanchez is a 46 y.o. year-old male presents to the ED with chief complaint of throat tightness and trouble swallowing for the past several hours.  He states that it feels like he is swallowing cotton.  He denies fever, sore throat, or cough.  Denies CP or SOB.  Denies any other associated symptoms.  History provided by patient.   Review of Systems  Pertinent positive and negative review of systems noted in HPI.    Physical Exam   Vitals:   02/12/22 0223 02/12/22 0449  BP:  114/77  Pulse:  82  Resp:  15  Temp: 98.2 F (36.8 C) 98.1 F (36.7 C)  SpO2:  100%    CONSTITUTIONAL:  well-appearing, NAD NEURO:  Alert and oriented x 3, CN 3-12 grossly intact EYES:  eyes equal and reactive ENT/NECK:  Supple, no stridor, swollen uvula with mild erythema, normal voice CARDIO:  normal rate, regular rhythm, appears well-perfused  PULM:  No respiratory distress, CTAB GI/GU:  non-distended, non tender MSK/SPINE:  No gross deformities, no edema, moves all extremities  SKIN:  no rash, atraumatic   *Additional and/or pertinent findings included in MDM below  Diagnostic and Interventional Summary    EKG Interpretation  Date/Time:    Ventricular Rate:    PR Interval:    QRS Duration:   QT Interval:    QTC Calculation:   R Axis:     Text Interpretation:         Labs Reviewed  URINALYSIS, ROUTINE W REFLEX MICROSCOPIC    DG Neck Soft Tissue  Final Result      Medications  ketorolac (TORADOL) 30 MG/ML injection 30 mg (30 mg Intramuscular Given 02/12/22 0439)  amoxicillin (AMOXIL) capsule 500 mg (500 mg Oral Given 02/12/22 0441)     Procedures  /  Critical Care Procedures  ED Course and Medical Decision Making  I have reviewed the triage vital signs, the nursing  notes, and pertinent available records from the EMR.  Social Determinants Affecting Complexity of Care: Patient has no clinically significant social determinants affecting this chief complaint..   ED Course:    Medical Decision Making Patient here with symptoms that seem consistent with uvulitis.  He's not drooling.  No stridor.  No voice changes.    No sign of epiglottitis on x-ray, low suspicion to begin with.   Treated with IM toradol and PO amox.    Patient also asks for urine to be tested for trich because he had a positive test a couple weeks ago.  UA was normal.  Amount and/or Complexity of Data Reviewed Labs: ordered.    Details: UA normal Radiology: ordered and independent interpretation performed.    Details: No thumb print sign  Risk Prescription drug management.     Consultants: No consultations were needed in caring for this patient.   Treatment and Plan: Emergency department workup does not suggest an emergent condition requiring admission or immediate intervention beyond  what has been performed at this time. The patient is safe for discharge and has  been instructed to return immediately for worsening symptoms, change in  symptoms or any other concerns    Final Clinical Impressions(s) / ED Diagnoses     ICD-10-CM   1. Uvulitis  K12.2  ED Discharge Orders          Ordered    amoxicillin (AMOXIL) 500 MG capsule  3 times daily        02/12/22 0520              Discharge Instructions Discussed with and Provided to Patient:   Discharge Instructions   None      Montine Circle, PA-C 02/12/22 0536    Orpah Greek, MD 02/12/22 [phone]

## 2022-02-12 NOTE — ED Triage Notes (Signed)
Pt states tonight he felt a weird sensation of not being able to breathe. His throat feels like cotton. Uvula is enlarged. Pt endorses vomiting.

## 2022-02-12 NOTE — Telephone Encounter (Signed)
Transition Care Management Follow-up Telephone Call Date of discharge and from where: 02/12/2022 [facility] ER How have you been since you were released from the hospital? Better Any questions or concerns? No  Items Reviewed: Did the pt receive and understand the discharge instructions provided? Yes  Medications obtained and verified? Yes pt has not pick up meds was encouraged to do so Other? No  Any new allergies since your discharge? No  Dietary orders reviewed? No Do you have support at home? Yes   Home Care and Equipment/Supplies: Were home health services ordered? not applicable Follow up appointments reviewed:  PCP Hospital f/u appt confirmed? No  pt declined If their condition worsens, is the pt aware to call PCP or go to the Emergency Dept.? Yes Was the patient provided with contact information for the PCP's office or ED? Yes Was to pt encouraged to call back with questions or concerns? Yes

## 2022-02-12 NOTE — ED Provider Triage Note (Signed)
  Emergency Medicine Provider Triage Evaluation Note  MRN:  [id number]  Arrival date & time: 02/12/22    Medically screening exam initiated at 2:35 AM.   CC:   Vomiting  HPI:  Milfred Krammes is a 46 y.o. year-old male presents to the ED with chief complaint of throat swelling sensation and vomiting.  Onset tonight.    History provided by patient. ROS:  -As included in HPI PE:   Vitals:   02/12/22 0222 02/12/22 0223  BP: (!) 160/107   Pulse: 86   Resp: 16   Temp:  98.2 F (36.8 C)  SpO2: 98%     Non-toxic appearing No respiratory distress Swollen uvula MDM:  Based on signs and symptoms, uvulitis is highest on my differential, followed by epiglottitis. I've ordered plain films in triage to expedite lab/diagnostic workup.  Patient was informed that the remainder of the evaluation will be completed by another provider, this initial triage assessment does not replace that evaluation, and the importance of remaining in the ED until their evaluation is complete.    Montine Circle, PA-C 02/12/22 [phone]

## 2022-04-22 ENCOUNTER — Other Ambulatory Visit: Payer: Self-pay | Admitting: Physician Assistant

## 2022-04-22 DIAGNOSIS — K219 Gastro-esophageal reflux disease without esophagitis: Secondary | ICD-10-CM

## 2022-04-22 DIAGNOSIS — I10 Essential (primary) hypertension: Secondary | ICD-10-CM
